# Patient Record
Sex: Female | Born: 1940 | Race: Black or African American | Hispanic: No | Marital: Married | Smoking: Never smoker
Health system: Southern US, Community
[De-identification: ages and names within clinical notes are randomized; demographics above are authoritative.]

## PROBLEM LIST (undated history)

## (undated) DIAGNOSIS — C4491 Basal cell carcinoma of skin, unspecified: Secondary | ICD-10-CM

## (undated) DIAGNOSIS — I10 Essential (primary) hypertension: Secondary | ICD-10-CM

## (undated) DIAGNOSIS — Z972 Presence of dental prosthetic device (complete) (partial): Secondary | ICD-10-CM

## (undated) DIAGNOSIS — M1990 Unspecified osteoarthritis, unspecified site: Secondary | ICD-10-CM

## (undated) DIAGNOSIS — E119 Type 2 diabetes mellitus without complications: Secondary | ICD-10-CM

## (undated) HISTORY — DX: Basal cell carcinoma of skin, unspecified: C44.91

## (undated) HISTORY — PX: BLADDER SURGERY: SHX569

---

## 2006-02-05 ENCOUNTER — Ambulatory Visit: Payer: Self-pay

## 2007-11-27 DIAGNOSIS — E785 Hyperlipidemia, unspecified: Secondary | ICD-10-CM | POA: Insufficient documentation

## 2007-12-11 DIAGNOSIS — I10 Essential (primary) hypertension: Secondary | ICD-10-CM | POA: Insufficient documentation

## 2008-01-07 ENCOUNTER — Ambulatory Visit: Payer: Self-pay

## 2009-02-24 ENCOUNTER — Ambulatory Visit: Payer: Self-pay

## 2009-11-29 ENCOUNTER — Inpatient Hospital Stay: Payer: Self-pay | Admitting: Internal Medicine

## 2010-06-07 ENCOUNTER — Ambulatory Visit: Payer: Self-pay | Admitting: Family Medicine

## 2011-02-13 DIAGNOSIS — M8580 Other specified disorders of bone density and structure, unspecified site: Secondary | ICD-10-CM | POA: Insufficient documentation

## 2012-12-26 ENCOUNTER — Ambulatory Visit: Payer: Self-pay | Admitting: Family Medicine

## 2013-07-28 DIAGNOSIS — N819 Female genital prolapse, unspecified: Secondary | ICD-10-CM | POA: Insufficient documentation

## 2014-06-10 DIAGNOSIS — N3946 Mixed incontinence: Secondary | ICD-10-CM | POA: Insufficient documentation

## 2015-02-08 ENCOUNTER — Ambulatory Visit: Payer: Self-pay | Admitting: Family Medicine

## 2016-05-14 ENCOUNTER — Emergency Department: Payer: Medicare Other

## 2016-05-14 ENCOUNTER — Encounter: Payer: Self-pay | Admitting: Emergency Medicine

## 2016-05-14 ENCOUNTER — Observation Stay
Admission: EM | Admit: 2016-05-14 | Discharge: 2016-05-15 | Disposition: A | Discharge status: Home / Self Care | Payer: Medicare Other | Attending: Internal Medicine | Admitting: Internal Medicine

## 2016-05-14 DIAGNOSIS — E876 Hypokalemia: Secondary | ICD-10-CM | POA: Insufficient documentation

## 2016-05-14 DIAGNOSIS — I081 Rheumatic disorders of both mitral and tricuspid valves: Secondary | ICD-10-CM | POA: Insufficient documentation

## 2016-05-14 DIAGNOSIS — I10 Essential (primary) hypertension: Secondary | ICD-10-CM | POA: Insufficient documentation

## 2016-05-14 DIAGNOSIS — R531 Weakness: Secondary | ICD-10-CM | POA: Insufficient documentation

## 2016-05-14 DIAGNOSIS — R51 Headache: Secondary | ICD-10-CM | POA: Diagnosis not present

## 2016-05-14 DIAGNOSIS — G8194 Hemiplegia, unspecified affecting left nondominant side: Secondary | ICD-10-CM | POA: Diagnosis not present

## 2016-05-14 DIAGNOSIS — G458 Other transient cerebral ischemic attacks and related syndromes: Secondary | ICD-10-CM | POA: Diagnosis present

## 2016-05-14 DIAGNOSIS — Z634 Disappearance and death of family member: Secondary | ICD-10-CM | POA: Insufficient documentation

## 2016-05-14 DIAGNOSIS — R4781 Slurred speech: Secondary | ICD-10-CM | POA: Diagnosis not present

## 2016-05-14 DIAGNOSIS — R29898 Other symptoms and signs involving the musculoskeletal system: Secondary | ICD-10-CM | POA: Diagnosis present

## 2016-05-14 DIAGNOSIS — E119 Type 2 diabetes mellitus without complications: Secondary | ICD-10-CM | POA: Insufficient documentation

## 2016-05-14 DIAGNOSIS — I493 Ventricular premature depolarization: Secondary | ICD-10-CM | POA: Insufficient documentation

## 2016-05-14 DIAGNOSIS — R200 Anesthesia of skin: Secondary | ICD-10-CM | POA: Insufficient documentation

## 2016-05-14 DIAGNOSIS — Z7982 Long term (current) use of aspirin: Secondary | ICD-10-CM | POA: Insufficient documentation

## 2016-05-14 DIAGNOSIS — I639 Cerebral infarction, unspecified: Secondary | ICD-10-CM | POA: Diagnosis not present

## 2016-05-14 DIAGNOSIS — E785 Hyperlipidemia, unspecified: Secondary | ICD-10-CM | POA: Diagnosis not present

## 2016-05-14 DIAGNOSIS — G459 Transient cerebral ischemic attack, unspecified: Secondary | ICD-10-CM | POA: Diagnosis present

## 2016-05-14 DIAGNOSIS — Z8673 Personal history of transient ischemic attack (TIA), and cerebral infarction without residual deficits: Secondary | ICD-10-CM | POA: Diagnosis not present

## 2016-05-14 DIAGNOSIS — Z79899 Other long term (current) drug therapy: Secondary | ICD-10-CM | POA: Diagnosis not present

## 2016-05-14 DIAGNOSIS — Z8249 Family history of ischemic heart disease and other diseases of the circulatory system: Secondary | ICD-10-CM | POA: Diagnosis not present

## 2016-05-14 DIAGNOSIS — I4581 Long QT syndrome: Secondary | ICD-10-CM | POA: Diagnosis not present

## 2016-05-14 HISTORY — DX: Essential (primary) hypertension: I10

## 2016-05-14 LAB — COMPREHENSIVE METABOLIC PANEL
ALBUMIN: 4.6 g/dL (ref 3.5–5.0)
ALK PHOS: 98 U/L (ref 38–126)
ALT: 24 U/L (ref 14–54)
AST: 21 U/L (ref 15–41)
Anion gap: 8 (ref 5–15)
BILIRUBIN TOTAL: 0.6 mg/dL (ref 0.3–1.2)
BUN: 11 mg/dL (ref 6–20)
CALCIUM: 9.2 mg/dL (ref 8.9–10.3)
CO2: 28 mmol/L (ref 22–32)
Chloride: 99 mmol/L — ABNORMAL LOW (ref 101–111)
Creatinine, Ser: 0.64 mg/dL (ref 0.44–1.00)
GFR calc Af Amer: >60 mL/min (ref >60)
GLUCOSE: 260 mg/dL — AB (ref 65–99)
Potassium: 3.3 mmol/L — ABNORMAL LOW (ref 3.5–5.1)
Sodium: 135 mmol/L (ref 135–145)
TOTAL PROTEIN: 8 g/dL (ref 6.5–8.1)

## 2016-05-14 LAB — CBC
HEMATOCRIT: 42.5 % (ref 35.0–47.0)
HEMOGLOBIN: 14.1 g/dL (ref 12.0–16.0)
MCH: 26.5 pg (ref 26.0–34.0)
MCHC: 33.1 g/dL (ref 32.0–36.0)
MCV: 80.3 fL (ref 80.0–100.0)
Platelets: 218 10*3/uL (ref 150–440)
RBC: 5.29 MIL/uL — AB (ref 3.80–5.20)
RDW: 13.5 % (ref 11.5–14.5)
WBC: 9.8 10*3/uL (ref 3.6–11.0)

## 2016-05-14 LAB — DIFFERENTIAL
Basophils Absolute: 0.1 10*3/uL (ref 0–0.1)
Basophils Relative: 1 %
Eosinophils Absolute: 0.1 10*3/uL (ref 0–0.7)
LYMPHS ABS: 1.6 10*3/uL (ref 1.0–3.6)
Monocytes Absolute: 0.5 10*3/uL (ref 0.2–0.9)
Neutro Abs: 7.5 10*3/uL — ABNORMAL HIGH (ref 1.4–6.5)

## 2016-05-14 LAB — TROPONIN I: Troponin I: <0.03 ng/mL (ref <0.031)

## 2016-05-14 MED ORDER — ENOXAPARIN SODIUM 40 MG/0.4ML ~~LOC~~ SOLN
40.0000 mg | SUBCUTANEOUS | Status: DC
  Administered 2016-05-14: 40 mg via SUBCUTANEOUS
  Filled 2016-05-14: qty 0.4

## 2016-05-14 MED ORDER — STROKE: EARLY STAGES OF RECOVERY BOOK
Freq: Once | Status: AC
  Administered 2016-05-14: 18:00:00

## 2016-05-14 MED ORDER — TIMOLOL MALEATE 0.5 % OP SOLN
1.0000 [drp] | Freq: Two times a day (BID) | OPHTHALMIC | Status: DC
Start: 2016-05-14 — End: 2016-05-15
  Administered 2016-05-14 – 2016-05-15 (×2): 1 [drp] via OPHTHALMIC
  Filled 2016-05-14: qty 5

## 2016-05-14 MED ORDER — LATANOPROST 0.005 % OP SOLN
1.0000 [drp] | Freq: Every day | OPHTHALMIC | Status: DC
  Administered 2016-05-14: 1 [drp] via OPHTHALMIC
  Filled 2016-05-14: qty 2.5

## 2016-05-14 MED ORDER — ASPIRIN 325 MG PO TABS
325.0000 mg | ORAL_TABLET | Freq: Every day | ORAL | Status: DC
  Administered 2016-05-14 – 2016-05-15 (×2): 325 mg via ORAL
  Filled 2016-05-14 (×2): qty 1

## 2016-05-14 MED ORDER — POTASSIUM CHLORIDE 10 MEQ/100ML IV SOLN
10.0000 meq | INTRAVENOUS | Status: AC
  Administered 2016-05-14 – 2016-05-15 (×3): 10 meq via INTRAVENOUS
  Filled 2016-05-14 (×4): qty 100

## 2016-05-14 MED ORDER — ASPIRIN 300 MG RE SUPP
300.0000 mg | Freq: Every day | RECTAL | Status: DC
  Filled 2016-05-14 (×2): qty 1

## 2016-05-14 MED ORDER — ASPIRIN 81 MG PO CHEW
324.0000 mg | CHEWABLE_TABLET | Freq: Once | ORAL | Status: AC
  Administered 2016-05-14: 324 mg via ORAL
  Filled 2016-05-14: qty 4

## 2016-05-14 NOTE — H&P (Signed)
[facility] [place] NAME: Cindy Singh    MR#:  [id number]  DATE OF BIRTH:  30-Oct-1941  DATE OF ADMISSION:  05/14/2016  PRIMARY CARE PHYSICIAN: [place]   REQUESTING/REFERRING PHYSICIAN: Joni Fears  CHIEF COMPLAINT:  Headache and left-sided weakness  HISTORY OF PRESENT ILLNESS:  Cindy Singh  is a 75 y.o. female with a known history of Essential hypertension and hyperlipidemia is brought into the ED for headache and left-sided weakness. Patient woke up this morning with bilateral headache and left-sided weakness and numbness. According to the daughter patient was talking very slowly and her speech was not clear when she called her around noontime. Patient's husband passed away yesterday and patient is under a lot of stress. Initial CT head is negative Speech is better during my examination and left-sided tingling and numbness is improving.  PAST MEDICAL HISTORY:   Past Medical History  Diagnosis Date  . Hypertension    Hyperlipidemia PAST SURGICAL HISTOIRY:   Past Surgical History  Procedure Laterality Date  . Bladder surgery      SOCIAL HISTORY:   Social History  Substance Use Topics  . Smoking status: Never Smoker   . Smokeless tobacco: Not on file  . Alcohol Use: No    FAMILY HISTORY:  Hypertension runs in her family  DRUG ALLERGIES:  No Known Allergies  REVIEW OF SYSTEMS:  CONSTITUTIONAL: No fever, fatigue or weakness.  EYES: No blurred or double vision.  EARS, NOSE, AND THROAT: No tinnitus or ear pain.  RESPIRATORY: No cough, shortness of breath, wheezing or hemoptysis.  CARDIOVASCULAR: No chest pain, orthopnea, edema.  GASTROINTESTINAL: No nausea, vomiting, diarrhea or abdominal pain.  GENITOURINARY: No dysuria, hematuria.  ENDOCRINE: No polyuria, nocturia,  HEMATOLOGY: No anemia, easy bruising or bleeding SKIN: No rash or lesion. MUSCULOSKELETAL: No joint pain or  arthritis.   NEUROLOGIC:Reporting headache and left upper extremity and lower extremity weakness and numbness  PSYCHIATRY: Feeling sad since her husband passed away yesterday No anxiety or depression.   MEDICATIONS AT HOME:   Prior to Admission medications   Medication Sig Start Date End Date Taking? Authorizing Provider  acetaminophen (TYLENOL) 500 MG tablet Take 1,000 mg by mouth every 6 (six) hours as needed for mild pain or headache.   Yes Historical Provider, MD  aspirin EC 81 MG tablet Take 81 mg by mouth daily.   Yes Historical Provider, MD  Calcium-Magnesium-Vitamin D (CALCIUM 1200+D3 PO) Take 1 tablet by mouth at bedtime.   Yes Historical Provider, MD  Garlic 10 MG CAPS Take 10 mg by mouth at bedtime.   Yes Historical Provider, MD  latanoprost (XALATAN) 0.005 % ophthalmic solution Place 1 drop into the right eye at bedtime.   Yes Historical Provider, MD  Multiple Vitamin (MULTIVITAMIN WITH MINERALS) TABS tablet Take 1 tablet by mouth daily.   Yes Historical Provider, MD  simvastatin (ZOCOR) 10 MG tablet Take 10 mg by mouth at bedtime.   Yes Historical Provider, MD  timolol (TIMOPTIC) 0.5 % ophthalmic solution Place 1 drop into both eyes 2 (two) times daily.   Yes Historical Provider, MD      VITAL SIGNS:  Blood pressure 167/98, pulse 80, temperature 98.1 F (36.7 C), temperature source Oral, resp. rate 20, height 5\' 3"  (1.6 m), weight 77.111 kg (170 lb), SpO2 96 %.  PHYSICAL EXAMINATION:  GENERAL:  75 y.o.-year-old patient lying in the bed with no acute distress.  EYES: Pupils equal, round, reactive  to light and accommodation. No scleral icterus. Extraocular muscles intact.  HEENT: Head atraumatic, normocephalic. Oropharynx and nasopharynx clear.  NECK:  Supple, no jugular venous distention. No thyroid enlargement, no tenderness.  LUNGS: Normal breath sounds bilaterally, no wheezing, rales,rhonchi or crepitation. No use of accessory muscles of respiration.  CARDIOVASCULAR: S1,  S2 normal. No murmurs, rubs, or gallops.  ABDOMEN: Soft, nontender, nondistended. Bowel sounds present. No organomegaly or mass.  EXTREMITIES: No pedal edema, cyanosis, or clubbing.  NEUROLOGIC: Cranial nerves II through XII are intact. Muscle strength 5/5 in all extremities. Sensation intact. Gait not checked.  PSYCHIATRIC: The patient is alert and oriented x 3.  SKIN: No obvious rash, lesion, or ulcer.   LABORATORY PANEL:   CBC  Recent Labs Lab 05/14/16 1508  WBC 9.8  HGB 14.1  HCT 42.5  PLT 218   ------------------------------------------------------------------------------------------------------------------  Chemistries   Recent Labs Lab 05/14/16 1508  NA 135  K 3.3*  CL 99*  CO2 28  GLUCOSE 260*  BUN 11  CREATININE 0.64  CALCIUM 9.2  AST 21  ALT 24  ALKPHOS 98  BILITOT 0.6   ------------------------------------------------------------------------------------------------------------------  Cardiac Enzymes  Recent Labs Lab 05/14/16 1508  TROPONINI <0.03   ------------------------------------------------------------------------------------------------------------------  RADIOLOGY:  Ct Head Wo Contrast  05/14/2016  CLINICAL DATA:  Left arm weakness starting this morning EXAM: CT HEAD WITHOUT CONTRAST TECHNIQUE: Contiguous axial images were obtained from the base of the skull through the vertex without intravenous contrast. COMPARISON:  None. FINDINGS: No intracranial hemorrhage, mass effect or midline shift. No definite acute cortical infarction. No mass lesion is noted on this unenhanced scan. Mild cerebral atrophy. Mild periventricular and patchy subcortical white matter decreased attenuation probable due to chronic small vessel ischemic changes. No skull fracture is noted. Paranasal sinuses and mastoid air cells are unremarkable. No pathologic calcifications are noted. IMPRESSION: No acute intracranial abnormality. No definite acute cortical infarction. Mild  cerebral atrophy. Mild periventricular and patchy subcortical white matter decreased attenuation probable due to chronic small vessel ischemic changes. Electronically Signed   By: Lahoma Crocker M.D.   On: 05/14/2016 14:57    EKG:   Orders placed or performed during the hospital encounter of 05/14/16  . EKG 12-Lead  . EKG 12-Lead  . ED EKG  . ED EKG    IMPRESSION AND PLAN:   Darnette Kusz  is a 75 y.o. female with a known history of Essential hypertension and hyperlipidemia is brought into the ED for headache and left-sided weakness. Patient woke up this morning with bilateral headache and left-sided weakness and numbness. According to the daughter patient was talking very slowly and her speech was not clear when she called her around noontime. Patient's husband passed away yesterday and patient is under a lot of stress. Initial CT head is negative   #TIA versus CVA Admit to MedSurg unit. CT head is negative. Her stroke workup with MRA of the brain and carotid Dopplers and 2-D echocardiogram Fasting lipid panel and hemoglobin A1c check PT and OT consult Bedside swallow evaluation by the RN Aspirin by mouth or PR Neurology consult Allow permissive hypertension  #Essential hypertension Blood pressure is elevated at this time but will allow permissive hypertension at this time Patient's blood pressure medications were discontinued by primary care physician few months ago as it was running low   #Hypokalemia replete potassium. Check a.m. labs  #Hyperlipidemia Check fasting lipid panel. Will start patient on high intensity statin after passing bedside swallow evaluation  #Bereavement reaction Patient  has good family support and social support. Seems to be coping well  Provide GI and DVT prophylaxis  All the records are reviewed and case discussed with ED provider. Management plans discussed with the patient, family and they are in agreement.  CODE STATUS: Full code/2 daughters  are the healthcare power of attorney  TOTAL TIME TAKING CARE OF THIS PATIENT:  21minutes.    Nicholes Mango M.D on 05/14/2016 at 5:27 PM  Between 7am to 6pm - Pager - [phone]  After 6pm go to [email or web address] - password [email or web address] [facility]  Office  [phone]  CC: Primary care physician; [place]

## 2016-05-14 NOTE — ED Notes (Signed)
States approx 830 or 9am noted L arm weakness. Speech slow but clear. L arm grip weak. States she awoke with headache but is not sure if her arm was weak at that time.

## 2016-05-14 NOTE — ED Provider Notes (Signed)
[facility] Emergency Department Provider Note  ____________________________________________  Time seen: 2:50 PM  I have reviewed the triage vital signs and the nursing notes.   HISTORY  Chief Complaint Extremity Weakness    HPI Cindy Singh is a 75 y.o. female who awoke this morning with a bilateral headache and left arm weakness and numbness. This was about 8:30 AM today. She was last known normal at less than at bedtime. She is also noted to be speaking very slowly when her daughter talked to her this morning. The nurse came to the house and noted that the left arm was weak as well. It seems to be improving at this moment.   Primary care is [facility]  Past Medical History  Diagnosis Date  . Hypertension      There are no active problems to display for this patient.    Past Surgical History  Procedure Laterality Date  . Bladder surgery       No current outpatient prescriptions on file. Aspirin Statin Multivitamin  Allergies Review of patient's allergies indicates no known allergies.   No family history on file.  Social History Social History  Substance Use Topics  . Smoking status: Never Smoker   . Smokeless tobacco: None  . Alcohol Use: No    Review of Systems  Constitutional:   No fever or chills.  Eyes:   No vision changes.  ENT:   No sore throat. No rhinorrhea. Cardiovascular:   No chest pain. Respiratory:   No dyspnea or cough. Gastrointestinal:   Negative for abdominal pain, vomiting and diarrhea.  Genitourinary:   Negative for dysuria or difficulty urinating. Musculoskeletal:   Negative for focal pain or swelling Neurological:   Positive bilateral frontal headache. Left arm weakness and numbness 10-point ROS otherwise negative.  ____________________________________________   PHYSICAL EXAM:  VITAL SIGNS: ED Triage Vitals  Enc Vitals Group     BP 05/14/16 1417 154/76 mmHg     Pulse Rate  05/14/16 1417 71     Resp 05/14/16 1417 20     Temp 05/14/16 1417 98.1 F (36.7 C)     Temp Source 05/14/16 1417 Oral     SpO2 05/14/16 1417 99 %     Weight 05/14/16 1417 170 lb (77.111 kg)     Height 05/14/16 1417 5\' 3"  (1.6 m)     Head Cir --      Peak Flow --      Pain Score 05/14/16 1418 10     Pain Loc --      Pain Edu? --      Excl. in [place]? --     Vital signs reviewed, nursing assessments reviewed.   Constitutional:   Alert and oriented. Well appearing and in no distress. Eyes:   No scleral icterus. No conjunctival pallor. PERRL. EOMI.  No nystagmus. ENT   Head:   Normocephalic and atraumatic.   Nose:   No congestion/rhinnorhea. No septal hematoma   Mouth/Throat:   MMM, no pharyngeal erythema. No peritonsillar mass.    Neck:   No stridor. No SubQ emphysema. No meningismus. Hematological/Lymphatic/Immunilogical:   No cervical lymphadenopathy. Cardiovascular:   RRR. Symmetric bilateral radial and DP pulses.  No murmurs.  Respiratory:   Normal respiratory effort without tachypnea nor retractions. Breath sounds are clear and equal bilaterally. No wheezes/rales/rhonchi. Gastrointestinal:   Soft and nontender. Non distended. There is no CVA tenderness.  No rebound, rigidity, or guarding. Genitourinary:   deferred Musculoskeletal:  Nontender with normal range of motion in all extremities. No joint effusions.  No lower extremity tenderness.  No edema. Neurologic:   Normal speech and language.  CN 2-10 normal. Motor grossly intact. No gross focal neurologic deficits are appreciated.  NIH stroke scale 0 Skin:    Skin is warm, dry and intact. No rash noted.  No petechiae, purpura, or bullae.  ____________________________________________    LABS (pertinent positives/negatives) (all labs ordered are listed, but only abnormal results are displayed) Labs Reviewed  CBC - Abnormal; Notable for the following:    RBC 5.29 (*)    All other components within normal limits   DIFFERENTIAL - Abnormal; Notable for the following:    Neutro Abs 7.5 (*)    All other components within normal limits  COMPREHENSIVE METABOLIC PANEL - Abnormal; Notable for the following:    Potassium 3.3 (*)    Chloride 99 (*)    Glucose, Bld 260 (*)    All other components within normal limits  TROPONIN I   ____________________________________________   EKG  Interpreted by me Sinus rhythm rate of 75, normal axis, prolonged QTC at 540 ms. Poor R-wave progression in anterior precordial leads. Normal ST segments. 3 PVCs on the strip. Isolated T-wave inversion in aVF which is nonspecific.  ____________________________________________    RADIOLOGY  CT head unremarkable  ____________________________________________   PROCEDURES   ____________________________________________   INITIAL IMPRESSION / ASSESSMENT AND PLAN / ED COURSE  Pertinent labs & imaging results that were available during my care of the patient were reviewed by me and considered in my medical decision making (see chart for details).  Patient well appearing no acute distress. Complains of clear stroke symptoms, unclear onset but definitely greater than 3 hours. By presentation to the emergency department it was loosely 6 hours since she woke up and noticed the symptoms which may have been present all night. Not a TPA candidate. Also not a candidate because of rapid improvement of symptoms and now resolution of stroke symptoms. Stroke scale is currently 0. We'll hospitalize for stroke workup given history of hyperlipidemia and no prior history of same symptoms.   ____________________________________________   FINAL CLINICAL IMPRESSION(S) / ED DIAGNOSES  Final diagnoses:  Other specified transient cerebral ischemias  Left arm weakness       Portions of this note were generated with dragon dictation software. Dictation errors may occur despite best attempts at proofreading.   Carrie Mew,  MD 05/14/16 [phone]

## 2016-05-14 NOTE — ED Notes (Signed)
Patient transported to CT 

## 2016-05-15 ENCOUNTER — Observation Stay: Payer: Medicare Other

## 2016-05-15 ENCOUNTER — Observation Stay
Admit: 2016-05-15 | Discharge: 2016-05-15 | Disposition: A | Discharge status: Home / Self Care | Payer: Medicare Other | Attending: Internal Medicine | Admitting: Internal Medicine

## 2016-05-15 DIAGNOSIS — I639 Cerebral infarction, unspecified: Secondary | ICD-10-CM

## 2016-05-15 LAB — ECHOCARDIOGRAM COMPLETE
Height: 63 in
WEIGHTICAEL: 2720 [oz_av]

## 2016-05-15 LAB — LIPID PANEL
Cholesterol: 199 mg/dL (ref 0–200)
HDL: 39 mg/dL — AB (ref >40)
LDL Cholesterol: 99 mg/dL (ref 0–99)
TRIGLYCERIDES: 304 mg/dL — AB (ref <150)
Total CHOL/HDL Ratio: 5.1 RATIO
VLDL: 61 mg/dL — ABNORMAL HIGH (ref 0–40)

## 2016-05-15 LAB — MAGNESIUM: MAGNESIUM: 2 mg/dL (ref 1.7–2.4)

## 2016-05-15 LAB — HEMOGLOBIN A1C: HEMOGLOBIN A1C: 9.1 % — AB (ref 4.0–6.0)

## 2016-05-15 MED ORDER — CLOPIDOGREL BISULFATE 75 MG PO TABS: 75.0000 mg | ORAL_TABLET | Freq: Every day | ORAL | Status: DC

## 2016-05-15 MED ORDER — METOPROLOL TARTRATE 25 MG PO TABS
12.5000 mg | ORAL_TABLET | Freq: Two times a day (BID) | ORAL | Status: DC
  Administered 2016-05-15: 12:00:00 12.5 mg via ORAL
  Filled 2016-05-15: qty 1

## 2016-05-15 MED ORDER — ATORVASTATIN CALCIUM 20 MG PO TABS
40.0000 mg | ORAL_TABLET | Freq: Every day | ORAL | Status: DC
  Administered 2016-05-15: 40 mg via ORAL
  Filled 2016-05-15: qty 2

## 2016-05-15 MED ORDER — METOPROLOL TARTRATE 25 MG PO TABS: 12.5000 mg | ORAL_TABLET | Freq: Two times a day (BID) | ORAL | Status: AC

## 2016-05-15 MED ORDER — CLOPIDOGREL BISULFATE 75 MG PO TABS
75.0000 mg | ORAL_TABLET | Freq: Every day | ORAL | Status: DC
  Administered 2016-05-15: 12:00:00 75 mg via ORAL
  Filled 2016-05-15: qty 1

## 2016-05-15 MED ORDER — ATORVASTATIN CALCIUM 40 MG PO TABS: 40.0000 mg | ORAL_TABLET | Freq: Every day | ORAL | Status: AC

## 2016-05-15 NOTE — Progress Notes (Signed)
Speech Therapy Note: received order, reviewed chart notes. Consulted NSG then pt and family. Pt denied any difficulty swallowing and is currently on a regular diet; tolerates swallowing pills w/ water per NSG. Pt conversed at conversational level w/out deficits noted; pt and family denied any speech-language deficits.  No further skilled ST services indicated as pt appears at her baseline. Pt agreed. NSG to reconsult if any change in status.

## 2016-05-15 NOTE — Discharge Instructions (Signed)
Heart healthy diet. °Activity as tolerated. °

## 2016-05-15 NOTE — Consult Note (Signed)
Referring Physician: Bridgett Larsson    Chief Complaint: Slurred speech, left arm numbness  HPI: Cindy Singh is an 75 y.o. female who reports that she began to have a right sided headache above the eye on Sunday.  On yesterday she awakened and the headache had returned.  Later in the morning she began to notice left arm numbness and her family noted slurred speech.  The patient was brought in for evaluation at that time.  Initial NIHSS of 0.    Date last known well: 05/14/2016 Time last known well: Time: 10:00 tPA Given: No: Minimal symptoms, outside time window  Past Medical History  Diagnosis Date  . Hypertension     Past Surgical History  Procedure Laterality Date  . Bladder surgery      Family history: Mother deceased with HTN and cancer.  Two daughters alive and well.    Social History:  reports that she has never smoked. She does not have any smokeless tobacco history on file. She reports that she does not drink alcohol. Her drug history is not on file.  Allergies: No Known Allergies  Medications:  I have reviewed the patient's current medications. Prior to Admission:  Prescriptions prior to admission  Medication Sig Dispense Refill Last Dose  . acetaminophen (TYLENOL) 500 MG tablet Take 1,000 mg by mouth every 6 (six) hours as needed for mild pain or headache.   05/14/2016 at 1000  . aspirin EC 81 MG tablet Take 81 mg by mouth daily.   05/14/2016 at 0900  . Calcium-Magnesium-Vitamin D (CALCIUM 1200+D3 PO) Take 1 tablet by mouth at bedtime.   05/13/2016 at Unknown time  . Garlic 10 MG CAPS Take 10 mg by mouth at bedtime.   05/13/2016 at Unknown time  . latanoprost (XALATAN) 0.005 % ophthalmic solution Place 1 drop into the right eye at bedtime.   05/13/2016 at Unknown time  . Multiple Vitamin (MULTIVITAMIN WITH MINERALS) TABS tablet Take 1 tablet by mouth daily.   05/14/2016 at Unknown time  . simvastatin (ZOCOR) 10 MG tablet Take 10 mg by mouth at bedtime.   05/13/2016 at  Unknown time  . timolol (TIMOPTIC) 0.5 % ophthalmic solution Place 1 drop into both eyes 2 (two) times daily.   05/14/2016 at Unknown time   Scheduled: . atorvastatin  40 mg Oral q1800  . clopidogrel  75 mg Oral Daily  . enoxaparin (LOVENOX) injection  40 mg Subcutaneous Q24H  . latanoprost  1 drop Right Eye QHS  . metoprolol tartrate  12.5 mg Oral BID  . timolol  1 drop Both Eyes BID    ROS: History obtained from the patient  General ROS: negative for - chills, fatigue, fever, night sweats, weight gain or weight loss Psychological ROS: negative for - behavioral disorder, hallucinations, memory difficulties, mood swings or suicidal ideation Ophthalmic ROS: negative for - blurry vision, double vision, eye pain or loss of vision ENT ROS: negative for - epistaxis, nasal discharge, oral lesions, sore throat, tinnitus or vertigo Allergy and Immunology ROS: negative for - hives or itchy/watery eyes Hematological and Lymphatic ROS: negative for - bleeding problems, bruising or swollen lymph nodes Endocrine ROS: negative for - galactorrhea, hair pattern changes, polydipsia/polyuria or temperature intolerance Respiratory ROS: negative for - cough, hemoptysis, shortness of breath or wheezing Cardiovascular ROS: negative for - chest pain, dyspnea on exertion, edema or irregular heartbeat Gastrointestinal ROS: negative for - abdominal pain, diarrhea, hematemesis, nausea/vomiting or stool incontinence Genito-Urinary ROS: negative for - dysuria, hematuria, incontinence  or urinary frequency/urgency Musculoskeletal ROS: negative for - joint swelling or muscular weakness Neurological ROS: as noted in HPI Dermatological ROS: negative for rash and skin lesion changes  Physical Examination: Blood pressure 157/82, pulse 77, temperature 98.1 F (36.7 C), temperature source Oral, resp. rate 17, height 5\' 3"  (1.6 m), weight 77.111 kg (170 lb), SpO2 98 %.  HEENT-  Normocephalic, no lesions, without obvious  abnormality.  Normal external eye and conjunctiva.  Normal TM's bilaterally.  Normal auditory canals and external ears. Normal external nose, mucus membranes and septum.  Normal pharynx. Cardiovascular- S1, S2 normal, pulses palpable throughout   Lungs- chest clear, no wheezing, rales, normal symmetric air entry Abdomen- soft, non-tender; bowel sounds normal; no masses,  no organomegaly Extremities- no edema Lymph-no adenopathy palpable Musculoskeletal-no joint tenderness, deformity or swelling Skin-warm and dry, no hyperpigmentation, vitiligo, or suspicious lesions  Neurological Examination Mental Status: Alert, oriented, thought content appropriate.  Speech fluent without evidence of aphasia.  Able to follow 3 step commands without difficulty. Cranial Nerves: II: Discs flat bilaterally; Visual fields grossly normal, pupils equal, round, reactive to light and accommodation III,IV, VI: ptosis not present, extra-ocular motions intact bilaterally V,VII: smile symmetric, facial light touch sensation normal bilaterally VIII: hearing normal bilaterally IX,X: gag reflex present XI: bilateral shoulder shrug XII: midline tongue extension Motor: Right : Upper extremity   5/5    Left:     Upper extremity   5/5 with mild pronator drift  Lower extremity   5/5     Lower extremity   5/5 Tone and bulk:normal tone throughout; no atrophy noted Sensory: Pinprick and light touch intact throughout, bilaterally Deep Tendon Reflexes: 2+ and symmetric with absent AJ's bilaterally Plantars: Right: downgoing   Left: downgoing Cerebellar: Normal finger-to-nose and normal heel-to-shin testing bilaterally Gait: not tested due to safety concerns   Laboratory Studies:  Basic Metabolic Panel:  Recent Labs Lab 05/14/16 1508 05/15/16 0428  NA 135  --   K 3.3*  --   CL 99*  --   CO2 28  --   GLUCOSE 260*  --   BUN 11  --   CREATININE 0.64  --   CALCIUM 9.2  --   MG  --  2.0    Liver Function  Tests:  Recent Labs Lab 05/14/16 1508  AST 21  ALT 24  ALKPHOS 98  BILITOT 0.6  PROT 8.0  ALBUMIN 4.6   No results for input(s): LIPASE, AMYLASE in the last 168 hours. No results for input(s): AMMONIA in the last 168 hours.  CBC:  Recent Labs Lab 05/14/16 1508  WBC 9.8  NEUTROABS 7.5*  HGB 14.1  HCT 42.5  MCV 80.3  PLT 218    Cardiac Enzymes:  Recent Labs Lab 05/14/16 1508  TROPONINI <0.03    BNP: Invalid input(s): POCBNP  CBG: No results for input(s): GLUCAP in the last 168 hours.  Microbiology: No results found for this or any previous visit.  Coagulation Studies: No results for input(s): LABPROT, INR in the last 72 hours.  Urinalysis: No results for input(s): COLORURINE, LABSPEC, PHURINE, GLUCOSEU, HGBUR, BILIRUBINUR, KETONESUR, PROTEINUR, UROBILINOGEN, NITRITE, LEUKOCYTESUR in the last 168 hours.  Invalid input(s): APPERANCEUR  Lipid Panel:    Component Value Date/Time   CHOL 199 05/15/2016 0428   TRIG 304* 05/15/2016 0428   HDL 39* 05/15/2016 0428   CHOLHDL 5.1 05/15/2016 0428   VLDL 61* 05/15/2016 0428   LDLCALC 99 05/15/2016 0428    HgbA1C: No results found  for: HGBA1C  Urine Drug Screen:  No results found for: LABOPIA, COCAINSCRNUR, LABBENZ, AMPHETMU, THCU, LABBARB  Alcohol Level: No results for input(s): ETH in the last 168 hours.  Other results: EKG: sinus rhythm at 75 bpm with frequent PVC's.  Imaging: Ct Head Wo Contrast  05/14/2016  CLINICAL DATA:  Left arm weakness starting this morning EXAM: CT HEAD WITHOUT CONTRAST TECHNIQUE: Contiguous axial images were obtained from the base of the skull through the vertex without intravenous contrast. COMPARISON:  None. FINDINGS: No intracranial hemorrhage, mass effect or midline shift. No definite acute cortical infarction. No mass lesion is noted on this unenhanced scan. Mild cerebral atrophy. Mild periventricular and patchy subcortical white matter decreased attenuation probable due to  chronic small vessel ischemic changes. No skull fracture is noted. Paranasal sinuses and mastoid air cells are unremarkable. No pathologic calcifications are noted. IMPRESSION: No acute intracranial abnormality. No definite acute cortical infarction. Mild cerebral atrophy. Mild periventricular and patchy subcortical white matter decreased attenuation probable due to chronic small vessel ischemic changes. Electronically Signed   By: Lahoma Crocker M.D.   On: 05/14/2016 14:57   Mr Brain Wo Contrast  05/15/2016  CLINICAL DATA:  75 year old hypertensive female with headache and left-sided weakness/numbness. Altered speech. Subsequent encounter. EXAM: MRI HEAD WITHOUT CONTRAST MRA HEAD WITHOUT CONTRAST TECHNIQUE: Multiplanar, multiecho pulse sequences of the brain and surrounding structures were obtained without intravenous contrast. Angiographic images of the head were obtained using MRA technique without contrast. COMPARISON:  05/14/2016 CT. FINDINGS: MRI HEAD FINDINGS Very small acute nonhemorrhagic infarct at the junction of the right parietal and occipital lobe. Remote right cerebellar infarct. Moderate chronic microvascular changes. 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 Otherwise no evidence of intracranial hemorrhage or intracranial mass noted on this unenhanced exam. Abnormality upper cervical spine centered at the C3 level with suggestion of cord compression. This is incompletely assessed on the present exam and contrast enhanced cervical spine MR recommended for further delineation. If the patient cannot have contrast, unenhanced MR cervical spine recommended. C4-5 cervical spondylotic changes and mild cord flattening. MRA HEAD FINDINGS Mild to moderate narrowing distal M1 segment/ bifurcation right middle cerebral artery. Mild narrowing left middle cerebral artery bifurcation. Middle cerebral artery moderate branch vessel narrowing and irregularity bilaterally. Mild  narrowing proximal A2 segment anterior cerebral artery greater on the left. Codominant vertebral arteries without significant narrowing of the distal vertebral arteries. Mild narrowing proximal basilar artery without high-grade stenosis. Nonvisualized right anterior inferior cerebellar artery. Small caliber left anterior inferior cerebellar artery. Moderate narrowing proximal left superior cerebellar artery. Mild to moderate narrowing distal left posterior cerebral artery branches. Mild narrowing distal right posterior cerebral artery branches. IMPRESSION: MRI HEAD Very small acute nonhemorrhagic infarct at the junction of the right parietal and occipital lobe. Remote right cerebellar infarct. Moderate chronic microvascular changes. 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 Abnormality upper cervical spine centered at the C3 level with suggestion of cord compression. This is incompletely assessed on the present exam and cervical spine MR (with and without contrast) recommended for further delineation. If the patient cannot have contrast, unenhanced MR cervical spine recommended. C4-5 cervical spondylotic changes and mild cord flattening. MRA HEAD Mild to moderate narrowing distal M1 segment/ bifurcation right middle cerebral artery. Mild narrowing left middle cerebral artery bifurcation. Middle cerebral artery moderate branch vessel narrowing and irregularity bilaterally. Mild narrowing proximal A2 segment anterior cerebral artery greater on the left. Mild narrowing proximal  basilar artery without high-grade stenosis. Nonvisualized right anterior inferior cerebellar artery. Small caliber left anterior inferior cerebellar artery. Moderate narrowing proximal left superior cerebellar artery. Mild to moderate narrowing distal left posterior cerebral artery branches. Mild narrowing distal right posterior cerebral artery branches. Electronically Signed   By: Genia Del M.D.   On:  05/15/2016 10:07   Mr Jodene Nam Head/brain Wo Cm  05/15/2016  CLINICAL DATA:  75 year old hypertensive female with headache and left-sided weakness/numbness. Altered speech. Subsequent encounter. EXAM: MRI HEAD WITHOUT CONTRAST MRA HEAD WITHOUT CONTRAST TECHNIQUE: Multiplanar, multiecho pulse sequences of the brain and surrounding structures were obtained without intravenous contrast. Angiographic images of the head were obtained using MRA technique without contrast. COMPARISON:  05/14/2016 CT. FINDINGS: MRI HEAD FINDINGS Very small acute nonhemorrhagic infarct at the junction of the right parietal and occipital lobe. Remote right cerebellar infarct. Moderate chronic microvascular changes. 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 Otherwise no evidence of intracranial hemorrhage or intracranial mass noted on this unenhanced exam. Abnormality upper cervical spine centered at the C3 level with suggestion of cord compression. This is incompletely assessed on the present exam and contrast enhanced cervical spine MR recommended for further delineation. If the patient cannot have contrast, unenhanced MR cervical spine recommended. C4-5 cervical spondylotic changes and mild cord flattening. MRA HEAD FINDINGS Mild to moderate narrowing distal M1 segment/ bifurcation right middle cerebral artery. Mild narrowing left middle cerebral artery bifurcation. Middle cerebral artery moderate branch vessel narrowing and irregularity bilaterally. Mild narrowing proximal A2 segment anterior cerebral artery greater on the left. Codominant vertebral arteries without significant narrowing of the distal vertebral arteries. Mild narrowing proximal basilar artery without high-grade stenosis. Nonvisualized right anterior inferior cerebellar artery. Small caliber left anterior inferior cerebellar artery. Moderate narrowing proximal left superior cerebellar artery. Mild to moderate narrowing distal left posterior  cerebral artery branches. Mild narrowing distal right posterior cerebral artery branches. IMPRESSION: MRI HEAD Very small acute nonhemorrhagic infarct at the junction of the right parietal and occipital lobe. Remote right cerebellar infarct. Moderate chronic microvascular changes. 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 Abnormality upper cervical spine centered at the C3 level with suggestion of cord compression. This is incompletely assessed on the present exam and cervical spine MR (with and without contrast) recommended for further delineation. If the patient cannot have contrast, unenhanced MR cervical spine recommended. C4-5 cervical spondylotic changes and mild cord flattening. MRA HEAD Mild to moderate narrowing distal M1 segment/ bifurcation right middle cerebral artery. Mild narrowing left middle cerebral artery bifurcation. Middle cerebral artery moderate branch vessel narrowing and irregularity bilaterally. Mild narrowing proximal A2 segment anterior cerebral artery greater on the left. Mild narrowing proximal basilar artery without high-grade stenosis. Nonvisualized right anterior inferior cerebellar artery. Small caliber left anterior inferior cerebellar artery. Moderate narrowing proximal left superior cerebellar artery. Mild to moderate narrowing distal left posterior cerebral artery branches. Mild narrowing distal right posterior cerebral artery branches. Electronically Signed   By: Genia Del M.D.   On: 05/15/2016 10:07    Assessment: 75 y.o. female presenting with complaints of slurred speech and left arm numbness.  Symptoms have resolved.  MRI of the brain personally reviewed and shows a small right parieto-occipital infarct.  Patient on ASA at home.  Echocardiogram and carotid dopplers are pending.  A1c pending.  LDL 99.    Stroke Risk Factors - hypertension  Plan: 1. PT consult, OT consult, Speech consult 2. Prophylactic therapy-Antiplatelet med:  Plavix - dose 75mg  daily 3. Telemetry monitoring 4. Frequent neuro checks 5. Lipid management with target LDL<70. 6. If above work up unremarkable, patient to be followed up on an outpatient basis.      Alexis Goodell, MD Neurology [phone] 05/15/2016, 11:57 AM

## 2016-05-15 NOTE — Progress Notes (Signed)
OT Cancellation Note  Patient Details Name: Cindy Singh MRN: [id number] DOB: [birth year]   Cancelled Treatment:    Reason Eval/Treat Not Completed: Patient at procedure or test/ unavailable Order received and chart reviewed.  Pt at MRI and daughter [name] in room.  Will attempt again later today.  Chrys Racer, OTR/L ascom [phone]   Wofford,Susan 05/15/2016, 10:23 AM

## 2016-05-15 NOTE — Discharge Summary (Signed)
[facility] NAME: Cindy Singh    MR#:  [id number]  DATE OF BIRTH:  [birth year]  DATE OF ADMISSION:  05/14/2016 ADMITTING PHYSICIAN: Nicholes Mango, MD  DATE OF DISCHARGE: 05/15/2016  1:42 PM  PRIMARY CARE PHYSICIAN: [place]    ADMISSION DIAGNOSIS:  Other specified transient cerebral ischemias [G45.8] Left arm weakness [R29.898]   DISCHARGE DIAGNOSIS:   Acute CVA SECONDARY DIAGNOSIS:   Past Medical History  Diagnosis Date  . Hypertension     HOSPITAL COURSE:  Acute CVA  CT head is negative. MRI of the brain shows acute CVA. Her stroke workup with MRA of the brain and carotid Dopplers: No stenosis and pending 2-D echocardiogram.  The patient was treated with aspirin 325 mg 1 dose last night. Discontinue aspirin, Changed to Plavix 75 mg by mouth daily and add Lipitor 40 mg daily per neurologist.  Hyperlipidemia. Start Lipitor.  Diabetes. hemoglobin A1C: 9.1. Diabetes diet.  PT and OT consult: No home PT or OT. Hypertension. BP 157/82. Start low-dose Lopressor 12.5 mg by mouth twice a day after discharge.  #Hypokalemia replete potassium.   I discussed with Dr. Tyron Russell, neurologist. DISCHARGE CONDITIONS:   Stable, discharged to home today.  CONSULTS OBTAINED:  Treatment Team:  Alexis Goodell, MD  DRUG ALLERGIES:  No Known Allergies  DISCHARGE MEDICATIONS:   Discharge Medication List as of 05/15/2016 12:14 PM    START taking these medications   Details  atorvastatin (LIPITOR) 40 MG tablet Take 1 tablet (40 mg total) by mouth daily at 6 PM., Starting 05/15/2016, Until Discontinued, Print    clopidogrel (PLAVIX) 75 MG tablet Take 1 tablet (75 mg total) by mouth daily., Starting 05/15/2016, Until Discontinued, Print    metoprolol tartrate (LOPRESSOR) 25 MG tablet Take 0.5 tablets (12.5 mg total) by mouth 2 (two) times daily., Starting 05/15/2016, Until Discontinued, Print       CONTINUE these medications which have NOT CHANGED   Details  acetaminophen (TYLENOL) 500 MG tablet Take 1,000 mg by mouth every 6 (six) hours as needed for mild pain or headache., Until Discontinued, Historical Med    Calcium-Magnesium-Vitamin D (CALCIUM 1200+D3 PO) Take 1 tablet by mouth at bedtime., Until Discontinued, Historical Med    Garlic 10 MG CAPS Take 10 mg by mouth at bedtime., Until Discontinued, Historical Med    latanoprost (XALATAN) 0.005 % ophthalmic solution Place 1 drop into the right eye at bedtime., Until Discontinued, Historical Med    Multiple Vitamin (MULTIVITAMIN WITH MINERALS) TABS tablet Take 1 tablet by mouth daily., Until Discontinued, Historical Med    timolol (TIMOPTIC) 0.5 % ophthalmic solution Place 1 drop into both eyes 2 (two) times daily., Until Discontinued, Historical Med      STOP taking these medications     aspirin EC 81 MG tablet      simvastatin (ZOCOR) 10 MG tablet          DISCHARGE INSTRUCTIONS:    If you experience worsening of your admission symptoms, develop shortness of breath, life threatening emergency, suicidal or homicidal thoughts you must seek medical attention immediately by calling 911 or calling your MD immediately  if symptoms less severe.  You Must read complete instructions/literature along with all the possible adverse reactions/side effects for all the Medicines you take and that have been prescribed to you. Take any new Medicines after you have completely understood and accept all the possible adverse reactions/side effects.   Please note  You were cared for by a hospitalist during your hospital stay. If you have any questions about your discharge medications or the care you received while you were in the hospital after you are discharged, you can call the unit and asked to speak with the hospitalist on call if the hospitalist that took care of you is not available. Once you are discharged, your primary care physician  will handle any further medical issues. Please note that NO REFILLS for any discharge medications will be authorized once you are discharged, as it is imperative that you return to your primary care physician (or establish a relationship with a primary care physician if you do not have one) for your aftercare needs so that they can reassess your need for medications and monitor your lab values.    Today   SUBJECTIVE   No complaint.   VITAL SIGNS:  Blood pressure 157/82, pulse 77, temperature 98.1 F (36.7 C), temperature source Oral, resp. rate 17, height 5\' 3"  (1.6 m), weight 77.111 kg (170 lb), SpO2 98 %.  I/O:  No intake or output data in the 24 hours ending 05/15/16 1654  PHYSICAL EXAMINATION:  GENERAL:  75 y.o.-year-old patient lying in the bed with no acute distress.  EYES: Pupils equal, round, reactive to light and accommodation. No scleral icterus. Extraocular muscles intact.  HEENT: Head atraumatic, normocephalic. Oropharynx and nasopharynx clear.  NECK:  Supple, no jugular venous distention. No thyroid enlargement, no tenderness.  LUNGS: Normal breath sounds bilaterally, no wheezing, rales,rhonchi or crepitation. No use of accessory muscles of respiration.  CARDIOVASCULAR: S1, S2 normal. No murmurs, rubs, or gallops.  ABDOMEN: Soft, non-tender, non-distended. Bowel sounds present. No organomegaly or mass.  EXTREMITIES: No pedal edema, cyanosis, or clubbing.  NEUROLOGIC: Cranial nerves II through XII are intact. Muscle strength 5/5 in all extremities. Sensation intact. Gait not checked.  PSYCHIATRIC: The patient is alert and oriented x 3.  SKIN: No obvious rash, lesion, or ulcer.   DATA REVIEW:   CBC  Recent Labs Lab 05/14/16 1508  WBC 9.8  HGB 14.1  HCT 42.5  PLT 218    Chemistries   Recent Labs Lab 05/14/16 1508 05/15/16 0428  NA 135  --   K 3.3*  --   CL 99*  --   CO2 28  --   GLUCOSE 260*  --   BUN 11  --   CREATININE 0.64  --   CALCIUM 9.2  --    MG  --  2.0  AST 21  --   ALT 24  --   ALKPHOS 98  --   BILITOT 0.6  --     Cardiac Enzymes  Recent Labs Lab 05/14/16 1508  TROPONINI <0.03    Microbiology Results  No results found for this or any previous visit.  RADIOLOGY:  Ct Head Wo Contrast  05/14/2016  CLINICAL DATA:  Left arm weakness starting this morning EXAM: CT HEAD WITHOUT CONTRAST TECHNIQUE: Contiguous axial images were obtained from the base of the skull through the vertex without intravenous contrast. COMPARISON:  None. FINDINGS: No intracranial hemorrhage, mass effect or midline shift. No definite acute cortical infarction. No mass lesion is noted on this unenhanced scan. Mild cerebral atrophy. Mild periventricular and patchy subcortical white matter decreased attenuation probable due to chronic small vessel ischemic changes. No skull fracture is noted. Paranasal sinuses and mastoid air cells are unremarkable. No pathologic calcifications are noted. IMPRESSION: No acute intracranial abnormality. No definite acute cortical infarction. Mild  cerebral atrophy. Mild periventricular and patchy subcortical white matter decreased attenuation probable due to chronic small vessel ischemic changes. Electronically Signed   By: Lahoma Crocker M.D.   On: 05/14/2016 14:57   Mr Brain Wo Contrast  05/15/2016  CLINICAL DATA:  75 year old hypertensive female with headache and left-sided weakness/numbness. Altered speech. Subsequent encounter. EXAM: MRI HEAD WITHOUT CONTRAST MRA HEAD WITHOUT CONTRAST TECHNIQUE: Multiplanar, multiecho pulse sequences of the brain and surrounding structures were obtained without intravenous contrast. Angiographic images of the head were obtained using MRA technique without contrast. COMPARISON:  05/14/2016 CT. FINDINGS: MRI HEAD FINDINGS Very small acute nonhemorrhagic infarct at the junction of the right parietal and occipital lobe. Remote right cerebellar infarct. Moderate chronic microvascular changes. 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 Otherwise no evidence of intracranial hemorrhage or intracranial mass noted on this unenhanced exam. Abnormality upper cervical spine centered at the C3 level with suggestion of cord compression. This is incompletely assessed on the present exam and contrast enhanced cervical spine MR recommended for further delineation. If the patient cannot have contrast, unenhanced MR cervical spine recommended. C4-5 cervical spondylotic changes and mild cord flattening. MRA HEAD FINDINGS Mild to moderate narrowing distal M1 segment/ bifurcation right middle cerebral artery. Mild narrowing left middle cerebral artery bifurcation. Middle cerebral artery moderate branch vessel narrowing and irregularity bilaterally. Mild narrowing proximal A2 segment anterior cerebral artery greater on the left. Codominant vertebral arteries without significant narrowing of the distal vertebral arteries. Mild narrowing proximal basilar artery without high-grade stenosis. Nonvisualized right anterior inferior cerebellar artery. Small caliber left anterior inferior cerebellar artery. Moderate narrowing proximal left superior cerebellar artery. Mild to moderate narrowing distal left posterior cerebral artery branches. Mild narrowing distal right posterior cerebral artery branches. IMPRESSION: MRI HEAD Very small acute nonhemorrhagic infarct at the junction of the right parietal and occipital lobe. Remote right cerebellar infarct. Moderate chronic microvascular changes. 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 Abnormality upper cervical spine centered at the C3 level with suggestion of cord compression. This is incompletely assessed on the present exam and cervical spine MR (with and without contrast) recommended for further delineation. If the patient cannot have contrast, unenhanced MR cervical spine recommended. C4-5 cervical spondylotic changes and  mild cord flattening. MRA HEAD Mild to moderate narrowing distal M1 segment/ bifurcation right middle cerebral artery. Mild narrowing left middle cerebral artery bifurcation. Middle cerebral artery moderate branch vessel narrowing and irregularity bilaterally. Mild narrowing proximal A2 segment anterior cerebral artery greater on the left. Mild narrowing proximal basilar artery without high-grade stenosis. Nonvisualized right anterior inferior cerebellar artery. Small caliber left anterior inferior cerebellar artery. Moderate narrowing proximal left superior cerebellar artery. Mild to moderate narrowing distal left posterior cerebral artery branches. Mild narrowing distal right posterior cerebral artery branches. Electronically Signed   By: Genia Del M.D.   On: 05/15/2016 10:07   US Carotid Bilateral  05/15/2016  CLINICAL DATA:  CVA.  History of hypertension and hyperlipidemia. EXAM: BILATERAL CAROTID DUPLEX ULTRASOUND TECHNIQUE: Pearline Cables scale imaging, color Doppler and duplex ultrasound were performed of bilateral carotid and vertebral arteries in the neck. COMPARISON:  Brain MRI - 05/15/2016 FINDINGS: Criteria: Quantification of carotid stenosis is based on velocity parameters that correlate the residual internal carotid diameter with NASCET-based stenosis levels, using the diameter of the distal internal carotid lumen as the denominator for stenosis measurement. The following velocity measurements were obtained: RIGHT ICA:  97/30 cm/sec CCA:  86/12 cm/sec  SYSTOLIC ICA/CCA RATIO:  1.1 DIASTOLIC ICA/CCA RATIO:  2.6 ECA:  73 cm/sec LEFT ICA:  104/30 cm/sec CCA:  123456 cm/sec SYSTOLIC ICA/CCA RATIO:  1.2 DIASTOLIC ICA/CCA RATIO:  1.2 ECA:  55 cm/sec RIGHT CAROTID ARTERY: There is mild tortuosity of the right common carotid artery (images 3 and 8). There is a moderate amount of eccentric mixed echogenic plaque within the right carotid bulb (image 15), not resulting elevated peak systolic velocities within the  interrogated course of the right internal carotid artery to suggest a hemodynamically significant stenosis. RIGHT VERTEBRAL ARTERY:  Antegrade Flow LEFT CAROTID ARTERY: There is no grayscale evidence significant intimal thickening or atherosclerotic plaque affecting interrogated portions of the left carotid system. There are no elevated peak systolic velocities within its imaged course of the left internal carotid artery to suggest a hemodynamically significant stenosis. LEFT VERTEBRAL ARTERY:  Antegrade Flow IMPRESSION: 1. Moderate amount of right-sided atherosclerotic plaque, not resulting in a hemodynamically significant stenosis. 2. Unremarkable sonographic evaluation of the left carotid system. Electronically Signed   By: Sandi Mariscal M.D.   On: 05/15/2016 13:08   Mr Mra Head/brain Wo Cm  05/15/2016  CLINICAL DATA:  75 year old hypertensive female with headache and left-sided weakness/numbness. Altered speech. Subsequent encounter. EXAM: MRI HEAD WITHOUT CONTRAST MRA HEAD WITHOUT CONTRAST TECHNIQUE: Multiplanar, multiecho pulse sequences of the brain and surrounding structures were obtained without intravenous contrast. Angiographic images of the head were obtained using MRA technique without contrast. COMPARISON:  05/14/2016 CT. FINDINGS: MRI HEAD FINDINGS Very small acute nonhemorrhagic infarct at the junction of the right parietal and occipital lobe. Remote right cerebellar infarct. Moderate chronic microvascular changes. 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 Otherwise no evidence of intracranial hemorrhage or intracranial mass noted on this unenhanced exam. Abnormality upper cervical spine centered at the C3 level with suggestion of cord compression. This is incompletely assessed on the present exam and contrast enhanced cervical spine MR recommended for further delineation. If the patient cannot have contrast, unenhanced MR cervical spine recommended. C4-5 cervical  spondylotic changes and mild cord flattening. MRA HEAD FINDINGS Mild to moderate narrowing distal M1 segment/ bifurcation right middle cerebral artery. Mild narrowing left middle cerebral artery bifurcation. Middle cerebral artery moderate branch vessel narrowing and irregularity bilaterally. Mild narrowing proximal A2 segment anterior cerebral artery greater on the left. Codominant vertebral arteries without significant narrowing of the distal vertebral arteries. Mild narrowing proximal basilar artery without high-grade stenosis. Nonvisualized right anterior inferior cerebellar artery. Small caliber left anterior inferior cerebellar artery. Moderate narrowing proximal left superior cerebellar artery. Mild to moderate narrowing distal left posterior cerebral artery branches. Mild narrowing distal right posterior cerebral artery branches. IMPRESSION: MRI HEAD Very small acute nonhemorrhagic infarct at the junction of the right parietal and occipital lobe. Remote right cerebellar infarct. Moderate chronic microvascular changes. 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 Abnormality upper cervical spine centered at the C3 level with suggestion of cord compression. This is incompletely assessed on the present exam and cervical spine MR (with and without contrast) recommended for further delineation. If the patient cannot have contrast, unenhanced MR cervical spine recommended. C4-5 cervical spondylotic changes and mild cord flattening. MRA HEAD Mild to moderate narrowing distal M1 segment/ bifurcation right middle cerebral artery. Mild narrowing left middle cerebral artery bifurcation. Middle cerebral artery moderate branch vessel narrowing and irregularity bilaterally. Mild narrowing proximal A2 segment anterior cerebral artery greater on the left. Mild narrowing proximal basilar artery  without high-grade stenosis. Nonvisualized right anterior inferior cerebellar artery. Small caliber  left anterior inferior cerebellar artery. Moderate narrowing proximal left superior cerebellar artery. Mild to moderate narrowing distal left posterior cerebral artery branches. Mild narrowing distal right posterior cerebral artery branches. Electronically Signed   By: Genia Del M.D.   On: 05/15/2016 10:07        Management plans discussed with the patient, her daughter and they are in agreement.  CODE STATUS:  Code Status History    Date Active Date Inactive Code Status Order ID Comments User Context   05/14/2016  6:07 PM 05/15/2016  4:52 PM Full Code [id number]  Nicholes Mango, MD Inpatient      TOTAL TIME TAKING CARE OF THIS PATIENT: 37 minutes.    Demetrios Loll M.D on 05/15/2016 at 4:54 PM  Between 7am to 6pm - Pager - [phone]  After 6pm go to [email or web address] - password [email or web address] [facility]  Office  [phone]  CC: Primary care physician; [place]

## 2016-05-15 NOTE — Progress Notes (Signed)
Patient discharged home. VSS. Prescriptions given to patient. All discharge instructions given and all questions answered.

## 2016-05-15 NOTE — Progress Notes (Signed)
  Results for FAUN, BAUSER (MRN [id number]) as of 05/15/2016 12:22  Ref. Range 05/14/2016 15:08  Glucose Latest Ref Range: 65-99 mg/dL 260 (H)   Please consider checking A1C to determine if blood sugars have been elevated for the past 2-3 months.  Thanks, Adah Perl, RN, BC-ADM Inpatient Diabetes Coordinator Pager [phone] (8a-5p)

## 2016-05-15 NOTE — Evaluation (Signed)
Occupational Therapy Evaluation Patient Details Name: Cindy Singh MRN: [id number] DOB: 1941-02-26 Today's Date: 05/15/2016    History of Present Illness Pt admitted for possible TIA. Pt with complaint of headache and L side weakness. Pt with history of HTN, and hyperlipidemia. Of note, pt's husband passed away the day before and pt has been under stress lately.    Clinical Impression   Pt presents with mild fatigue but all other symptoms of headache and L side weakness with numbness and tingling have resolved. She has intact sensation and full AROM in BUEs and hands.  She does not present with any balance deficits and is able to ambulate to the bathroom without LOB and supervision only needed.  Patient is at baseline for ADLs and is not in need of any further OT. She has a shower chair with back in tub and rec supervision from daughter for first few weeks when stepping onto tub.  Pt seen for evaluation only.    Follow Up Recommendations       Equipment Recommendations       Recommendations for Other Services       Precautions / Restrictions Precautions Precautions: Fall Restrictions Weight Bearing Restrictions: No      Mobility Bed Mobility Overal bed mobility: Independent             General bed mobility comments: safe technique performed  Transfers Overall transfer level: Independent Equipment used: None             General transfer comment: safe technique performed    Balance Overall balance assessment: Independent                                          ADL Overall ADL's : At baseline                                       General ADL Comments: Pt's symjptoms have resolved and presents iwth intact sensation and at baseline for ADLs with no assist needed and no balance problems when stanidng at sink or ambulating to bathroom.     Vision     Perception     Praxis      Pertinent Vitals/Pain Pain  Assessment: No/denies pain     Hand Dominance Right   Extremity/Trunk Assessment Upper Extremity Assessment Upper Extremity Assessment: Overall WFL for tasks assessed   Lower Extremity Assessment Lower Extremity Assessment: Defer to PT evaluation       Communication Communication Communication: No difficulties   Cognition Arousal/Alertness: Awake/alert Behavior During Therapy: WFL for tasks assessed/performed Overall Cognitive Status: Within Functional Limits for tasks assessed                     General Comments       Exercises       Shoulder Instructions      Home Living Family/patient expects to be discharged to:: Private residence Living Arrangements: Alone Available Help at Discharge: Family Type of Home: House Home Access: Level entry     Home Layout: One level     Bathroom Shower/Tub: Tub/shower unit Shower/tub characteristics: Architectural technologist: Standard Bathroom Accessibility: Yes How Accessible: Accessible via wheelchair Home Equipment: Environmental consultant - 2 wheels;Cane - single point  Prior Functioning/Environment Level of Independence: Independent        Comments: driving and active in church    OT Diagnosis:     OT Problem List:     OT Treatment/Interventions:      OT Goals(Current goals can be found in the care plan section) Acute Rehab OT Goals Patient Stated Goal: to go home  OT Frequency:     Barriers to D/C:            Co-evaluation              End of Session    Activity Tolerance: Patient tolerated treatment well Patient left: in bed;with family/visitor present;with call bell/phone within reach;with bed alarm set   Time: [id number] OT Time Calculation (min): 30 min Charges:  OT General Charges $OT Visit: 1 Procedure OT Evaluation $OT Eval Low Complexity: 1 Procedure OT Treatments $Self Care/Home Management : 8-22 mins G-Codes: OT G-codes **NOT FOR INPATIENT CLASS** Functional Limitation:  Self care Self Care Current Status [id number]): 0 percent impaired, limited or restricted Self Care Goal Status [id number]): 0 percent impaired, limited or restricted   Chrys Racer, OTR/L ascom [phone] 05/15/2016, 11:04 AM

## 2016-05-15 NOTE — Care Management (Signed)
Admitted to [facility] under observation status with the diagnosis of TIA. Son, Milbert Coulter [phone]) lives with Cindy Singh. Other son is Frankey Poot [phone]) [facility] last week. No Home Health. No skilled facility. Uses no aids for ambulation. No falls. Good appetite. Takes care of all basic and instrumental activities of daily living herself, drives. Daughter will transport. Shelbie Ammons RN MSN CCM Care Management [phone]

## 2016-05-15 NOTE — Progress Notes (Signed)
*  PRELIMINARY RESULTS* Echocardiogram 2D Echocardiogram has been performed.  Cindy Singh 05/15/2016, 7:33 AM

## 2016-05-15 NOTE — Care Management Obs Status (Signed)
[facility] NOTIFICATION   Patient Details  Name: Cindy Singh MRN: [id number] Date of Birth: [birth year]   Medicare Observation Status Notification Given:  Yes    Shelbie Ammons, RN 05/15/2016, 9:48 AM

## 2016-05-15 NOTE — Evaluation (Signed)
Physical Therapy Evaluation Patient Details Name: Cindy Singh MRN: [id number] DOB: 1941/10/07 Today's Date: 05/15/2016   History of Present Illness  Pt admitted for possible TIA. Pt with complaint of headache and L side weakness. Pt with history of HTN, and hyperlipidemia. Of note, pt's husband passed away the day before and pt has been under stress lately.   Clinical Impression  Pt is a pleasant 75 year old female who was admitted for possible TIA.  Pt demonstrates all bed mobility/transfers/ambulation at baseline level. Pt with negative coordination/sensation testing. Pt does not require any further PT needs at this time. Pt feels she is at baseline level regarding physical mobility. Pt has good support system at home. Pt will be dc in house and does not require follow up. RN aware. Will dc current orders.      Follow Up Recommendations No PT follow up    Equipment Recommendations       Recommendations for Other Services       Precautions / Restrictions Precautions Precautions: Fall Restrictions Weight Bearing Restrictions: No      Mobility  Bed Mobility Overal bed mobility: Independent             General bed mobility comments: safe technique performed  Transfers Overall transfer level: Independent Equipment used: None             General transfer comment: safe technique performed  Ambulation/Gait Ambulation/Gait assistance: Min guard Ambulation Distance (Feet): 80 Feet Assistive device: None Gait Pattern/deviations: Step-through pattern     General Gait Details: pt demonstrated reciprocal gait pattern. Safe technique noted. Pt ambulated 10 ft in 7 seconds demonstrating good speed. No LOB noted.  Stairs            Wheelchair Mobility    Modified Rankin (Stroke Patients Only)       Balance Overall balance assessment: Independent                                           Pertinent Vitals/Pain Pain  Assessment: No/denies pain    Home Living Family/patient expects to be discharged to:: Private residence Living Arrangements: Alone Available Help at Discharge: Family Type of Home: House Home Access: Level entry     [place]: One [place]: Environmental consultant - 2 wheels;Cane - single point      Prior Function Level of Independence: Independent               Hand Dominance        Extremity/Trunk Assessment   Upper Extremity Assessment: Overall WFL for tasks assessed           Lower Extremity Assessment: Overall WFL for tasks assessed         Communication   Communication: No difficulties  Cognition Arousal/Alertness: Awake/alert Behavior During Therapy: WFL for tasks assessed/performed Overall Cognitive Status: Within Functional Limits for tasks assessed                      General Comments      Exercises        Assessment/Plan    PT Assessment Patent does not need any further PT services  PT Diagnosis     PT Problem List    PT Treatment Interventions     PT Goals (Current goals can be found in the Care Plan section) Acute Rehab PT  Goals Patient Stated Goal: to go home PT Goal Formulation: All assessment and education complete, DC therapy Time For Goal Achievement: 05/15/16 Potential to Achieve Goals: Good    Frequency     Barriers to discharge        Co-evaluation               End of Session Equipment Utilized During Treatment: Gait belt Activity Tolerance: Patient tolerated treatment well Patient left: in chair;with chair alarm set Nurse Communication: Mobility status    Functional Assessment Tool Used: gait speed Functional Limitation: Mobility: Walking and moving around Mobility: Walking and Moving Around Current Status [phone]): 0 percent impaired, limited or restricted Mobility: Walking and Moving Around Goal Status [phone]): 0 percent impaired, limited or restricted Mobility: Walking and Moving Around  Discharge Status [phone]): 0 percent impaired, limited or restricted    Time: [id number] PT Time Calculation (min) (ACUTE ONLY): 14 min   Charges:   PT Evaluation $PT Eval Low Complexity: 1 Procedure     PT G Codes:   PT G-Codes **NOT FOR INPATIENT CLASS** Functional Assessment Tool Used: gait speed Functional Limitation: Mobility: Walking and moving around Mobility: Walking and Moving Around Current Status [id number]): 0 percent impaired, limited or restricted Mobility: Walking and Moving Around Goal Status [phone]): 0 percent impaired, limited or restricted Mobility: Walking and Moving Around Discharge Status [phone]): 0 percent impaired, limited or restricted    Cindy Singh 05/15/2016, 10:34 AM Greggory Stallion, PT, DPT [phone]

## 2016-06-25 DIAGNOSIS — E119 Type 2 diabetes mellitus without complications: Secondary | ICD-10-CM | POA: Insufficient documentation

## 2017-05-03 ENCOUNTER — Other Ambulatory Visit: Payer: Self-pay | Admitting: Family Medicine

## 2017-05-03 DIAGNOSIS — Z1231 Encounter for screening mammogram for malignant neoplasm of breast: Secondary | ICD-10-CM

## 2017-06-10 ENCOUNTER — Ambulatory Visit
Admission: RE | Admit: 2017-06-10 | Discharge: 2017-06-10 | Disposition: A | Discharge status: Home / Self Care | Payer: Medicare Other | Source: Ambulatory Visit | Attending: Family Medicine | Admitting: Family Medicine

## 2017-06-10 DIAGNOSIS — Z1231 Encounter for screening mammogram for malignant neoplasm of breast: Secondary | ICD-10-CM | POA: Diagnosis not present

## 2018-08-19 ENCOUNTER — Ambulatory Visit
Admission: RE | Admit: 2018-08-19 | Discharge: 2018-08-19 | Disposition: A | Discharge status: Home / Self Care | Payer: Medicare Other | Source: Ambulatory Visit | Attending: Family Medicine | Admitting: Family Medicine

## 2018-08-19 ENCOUNTER — Other Ambulatory Visit: Payer: Self-pay | Admitting: Family Medicine

## 2018-08-19 DIAGNOSIS — W19XXXA Unspecified fall, initial encounter: Secondary | ICD-10-CM | POA: Insufficient documentation

## 2018-08-19 DIAGNOSIS — M19042 Primary osteoarthritis, left hand: Secondary | ICD-10-CM | POA: Insufficient documentation

## 2018-08-19 DIAGNOSIS — M7989 Other specified soft tissue disorders: Secondary | ICD-10-CM | POA: Insufficient documentation

## 2018-08-19 DIAGNOSIS — R52 Pain, unspecified: Secondary | ICD-10-CM

## 2018-08-19 DIAGNOSIS — S6992XA Unspecified injury of left wrist, hand and finger(s), initial encounter: Secondary | ICD-10-CM | POA: Diagnosis present

## 2018-08-19 DIAGNOSIS — S60222A Contusion of left hand, initial encounter: Secondary | ICD-10-CM | POA: Insufficient documentation

## 2019-08-13 DIAGNOSIS — H409 Unspecified glaucoma: Secondary | ICD-10-CM | POA: Insufficient documentation

## 2019-08-13 DIAGNOSIS — R200 Anesthesia of skin: Secondary | ICD-10-CM | POA: Insufficient documentation

## 2020-01-11 ENCOUNTER — Other Ambulatory Visit: Payer: Self-pay | Admitting: Family Medicine

## 2020-01-11 DIAGNOSIS — Z1231 Encounter for screening mammogram for malignant neoplasm of breast: Secondary | ICD-10-CM

## 2020-01-14 ENCOUNTER — Ambulatory Visit
Admission: RE | Admit: 2020-01-14 | Discharge: 2020-01-14 | Disposition: A | Discharge status: Home / Self Care | Payer: Medicare Other | Source: Ambulatory Visit | Attending: Family Medicine | Admitting: Family Medicine

## 2020-01-14 ENCOUNTER — Other Ambulatory Visit: Payer: Self-pay

## 2020-01-14 DIAGNOSIS — Z1231 Encounter for screening mammogram for malignant neoplasm of breast: Secondary | ICD-10-CM | POA: Diagnosis present

## 2020-08-02 ENCOUNTER — Encounter: Payer: Self-pay | Admitting: Ophthalmology

## 2020-08-02 ENCOUNTER — Other Ambulatory Visit: Payer: Self-pay

## 2020-08-04 ENCOUNTER — Other Ambulatory Visit: Payer: Self-pay

## 2020-08-04 ENCOUNTER — Other Ambulatory Visit
Admission: RE | Admit: 2020-08-04 | Discharge: 2020-08-04 | Disposition: A | Discharge status: Home / Self Care | Payer: Medicare Other | Source: Ambulatory Visit | Attending: Ophthalmology | Admitting: Ophthalmology

## 2020-08-04 DIAGNOSIS — Z01812 Encounter for preprocedural laboratory examination: Secondary | ICD-10-CM | POA: Insufficient documentation

## 2020-08-04 DIAGNOSIS — Z20822 Contact with and (suspected) exposure to covid-19: Secondary | ICD-10-CM | POA: Diagnosis not present

## 2020-08-04 NOTE — Discharge Instructions (Signed)

## 2020-08-05 LAB — SARS CORONAVIRUS 2 (TAT 6-24 HRS): SARS Coronavirus 2: NEGATIVE

## 2020-08-05 NOTE — Anesthesia Preprocedure Evaluation (Addendum)
Anesthesia Evaluation  Patient identified by MRN, date of birth, ID band Patient awake    Reviewed: Allergy & Precautions, NPO status , Patient's Chart, lab work & pertinent test results, reviewed documented beta blocker date and time   History of Anesthesia Complications Negative for: history of anesthetic complications  Airway Mallampati: III  TM Distance: >3 FB Neck ROM: Full    Dental  (+) Upper Dentures, Lower Dentures   Pulmonary    breath sounds clear to auscultation       Cardiovascular hypertension, (-) angina(-) DOE  Rhythm:Regular Rate:Normal     Neuro/Psych TIA   GI/Hepatic neg GERD  ,  Endo/Other  diabetes, Type 2  Renal/GU      Musculoskeletal  (+) Arthritis ,   Abdominal   Peds  Hematology   Anesthesia Other Findings   Reproductive/Obstetrics                            Anesthesia Physical Anesthesia Plan  ASA: II  Anesthesia Plan: MAC   Post-op Pain Management:    Induction: Intravenous  PONV Risk Score and Plan: 2 and TIVA, Midazolam and Treatment may vary due to age or medical condition  Airway Management Planned: Nasal Cannula  Additional Equipment:   Intra-op Plan:   Post-operative Plan:   Informed Consent: I have reviewed the patients History and Physical, chart, labs and discussed the procedure including the risks, benefits and alternatives for the proposed anesthesia with the patient or authorized representative who has indicated his/her understanding and acceptance.       Plan Discussed with: CRNA and Anesthesiologist  Anesthesia Plan Comments:         Anesthesia Quick Evaluation

## 2020-08-08 ENCOUNTER — Ambulatory Visit
Admission: RE | Admit: 2020-08-08 | Discharge: 2020-08-08 | Disposition: A | Discharge status: Home / Self Care | Payer: Medicare Other | Attending: Ophthalmology | Admitting: Ophthalmology

## 2020-08-08 ENCOUNTER — Other Ambulatory Visit: Payer: Self-pay

## 2020-08-08 ENCOUNTER — Encounter
Admission: RE | Disposition: A | Discharge status: Home / Self Care | Payer: Self-pay | Source: Home / Self Care | Attending: Ophthalmology

## 2020-08-08 ENCOUNTER — Ambulatory Visit: Payer: Medicare Other | Admitting: Anesthesiology

## 2020-08-08 ENCOUNTER — Encounter: Payer: Self-pay | Admitting: Ophthalmology

## 2020-08-08 DIAGNOSIS — Z79899 Other long term (current) drug therapy: Secondary | ICD-10-CM | POA: Insufficient documentation

## 2020-08-08 DIAGNOSIS — E7800 Pure hypercholesterolemia, unspecified: Secondary | ICD-10-CM | POA: Insufficient documentation

## 2020-08-08 DIAGNOSIS — M1990 Unspecified osteoarthritis, unspecified site: Secondary | ICD-10-CM | POA: Diagnosis not present

## 2020-08-08 DIAGNOSIS — H401111 Primary open-angle glaucoma, right eye, mild stage: Secondary | ICD-10-CM | POA: Insufficient documentation

## 2020-08-08 DIAGNOSIS — Z7982 Long term (current) use of aspirin: Secondary | ICD-10-CM | POA: Insufficient documentation

## 2020-08-08 DIAGNOSIS — I10 Essential (primary) hypertension: Secondary | ICD-10-CM | POA: Insufficient documentation

## 2020-08-08 DIAGNOSIS — Z7984 Long term (current) use of oral hypoglycemic drugs: Secondary | ICD-10-CM | POA: Diagnosis not present

## 2020-08-08 DIAGNOSIS — E1136 Type 2 diabetes mellitus with diabetic cataract: Secondary | ICD-10-CM | POA: Insufficient documentation

## 2020-08-08 DIAGNOSIS — H2511 Age-related nuclear cataract, right eye: Secondary | ICD-10-CM | POA: Diagnosis not present

## 2020-08-08 HISTORY — PX: CATARACT EXTRACTION W/PHACO: SHX586

## 2020-08-08 HISTORY — DX: Type 2 diabetes mellitus without complications: E11.9

## 2020-08-08 HISTORY — DX: Unspecified osteoarthritis, unspecified site: M19.90

## 2020-08-08 HISTORY — DX: Presence of dental prosthetic device (complete) (partial): Z97.2

## 2020-08-08 LAB — GLUCOSE, CAPILLARY
Glucose-Capillary: 171 mg/dL — ABNORMAL HIGH (ref 70–99)
Glucose-Capillary: 184 mg/dL — ABNORMAL HIGH (ref 70–99)

## 2020-08-08 SURGERY — PHACOEMULSIFICATION, CATARACT, WITH IOL INSERTION
Anesthesia: Monitor Anesthesia Care | Site: Eye | Laterality: Right

## 2020-08-08 MED ORDER — MIDAZOLAM HCL 2 MG/2ML IJ SOLN
INTRAMUSCULAR | Status: DC | PRN
  Administered 2020-08-08: 1 mg via INTRAVENOUS

## 2020-08-08 MED ORDER — FENTANYL CITRATE (PF) 100 MCG/2ML IJ SOLN
INTRAMUSCULAR | Status: DC | PRN
  Administered 2020-08-08: 50 ug via INTRAVENOUS

## 2020-08-08 MED ORDER — LACTATED RINGERS IV SOLN: INTRAVENOUS | Status: DC

## 2020-08-08 MED ORDER — EPINEPHRINE PF 1 MG/ML IJ SOLN
INTRAOCULAR | Status: DC | PRN
  Administered 2020-08-08: 124 mL via OPHTHALMIC

## 2020-08-08 MED ORDER — SODIUM HYALURONATE 23 MG/ML IO SOLN
INTRAOCULAR | Status: DC | PRN
  Administered 2020-08-08: 0.6 mL via INTRAOCULAR

## 2020-08-08 MED ORDER — SODIUM HYALURONATE 10 MG/ML IO SOLN
INTRAOCULAR | Status: DC | PRN
  Administered 2020-08-08: 0.55 mL via INTRAOCULAR

## 2020-08-08 MED ORDER — ARMC OPHTHALMIC DILATING DROPS
1.0000 "application " | OPHTHALMIC | Status: DC | PRN
  Administered 2020-08-08 (×3): 1 via OPHTHALMIC

## 2020-08-08 MED ORDER — TETRACAINE HCL 0.5 % OP SOLN
1.0000 [drp] | OPHTHALMIC | Status: DC | PRN
  Administered 2020-08-08 (×3): 1 [drp] via OPHTHALMIC

## 2020-08-08 MED ORDER — MOXIFLOXACIN HCL 0.5 % OP SOLN
OPHTHALMIC | Status: DC | PRN
  Administered 2020-08-08: 0.2 mL via OPHTHALMIC

## 2020-08-08 MED ORDER — ONDANSETRON HCL 4 MG/2ML IJ SOLN: 4.0000 mg | Freq: Once | INTRAMUSCULAR | Status: DC | PRN

## 2020-08-08 MED ORDER — ACETAMINOPHEN 10 MG/ML IV SOLN: 1000.0000 mg | Freq: Once | INTRAVENOUS | Status: DC | PRN

## 2020-08-08 MED ORDER — LIDOCAINE HCL (PF) 2 % IJ SOLN
INTRAOCULAR | Status: DC | PRN
  Administered 2020-08-08: 1 mL via INTRAOCULAR

## 2020-08-08 SURGICAL SUPPLY — 23 items
CANNULA ANT/CHMB 27G (MISCELLANEOUS) ×2 IMPLANT
CANNULA ANT/CHMB 27GA (MISCELLANEOUS) ×4 IMPLANT
DEVICE INJECT ISTENT W (Stent) IMPLANT
DISSECTOR HYDRO NUCLEUS 50X22 (MISCELLANEOUS) ×2 IMPLANT
GLOVE SURG LX 7.5 STRW (GLOVE) ×2
GLOVE SURG LX STRL 7.5 STRW (GLOVE) ×1 IMPLANT
GLOVE SURG SYN 8.5  E (GLOVE) ×1
GLOVE SURG SYN 8.5 E (GLOVE) ×1 IMPLANT
GLOVE SURG SYN 8.5 PF PI (GLOVE) ×1 IMPLANT
GOWN STRL REUS W/ TWL LRG LVL3 (GOWN DISPOSABLE) ×2 IMPLANT
GOWN STRL REUS W/TWL LRG LVL3 (GOWN DISPOSABLE) ×4
ICLIP (OPHTHALMIC RELATED) ×1 IMPLANT
INJECT ISTENT W (Stent) ×2 IMPLANT
LENS IOL DIOP 22.5 (Intraocular Lens) ×2 IMPLANT
LENS IOL TECNIS MONO 22.5 (Intraocular Lens) IMPLANT
MARKER SKIN DUAL TIP RULER LAB (MISCELLANEOUS) ×2 IMPLANT
PACK DR. KING ARMS (PACKS) ×2 IMPLANT
PACK EYE AFTER SURG (MISCELLANEOUS) ×2 IMPLANT
PACK OPTHALMIC (MISCELLANEOUS) ×2 IMPLANT
SYR 3ML LL SCALE MARK (SYRINGE) ×2 IMPLANT
SYR TB 1ML LUER SLIP (SYRINGE) ×2 IMPLANT
WATER STERILE IRR 250ML POUR (IV SOLUTION) ×2 IMPLANT
WIPE NON LINTING 3.25X3.25 (MISCELLANEOUS) ×2 IMPLANT

## 2020-08-08 NOTE — Anesthesia Postprocedure Evaluation (Signed)
Anesthesia Post Note  Patient: Cindy Singh  Procedure(s) Performed: CATARACT EXTRACTION PHACO AND INTRAOCULAR LENS PLACEMENT (IOC) RIGHT ISTENT INJ W DIABETIC (Right Eye)     Patient location during evaluation: PACU Anesthesia Type: MAC Level of consciousness: awake and alert Pain management: pain level controlled Vital Signs Assessment: post-procedure vital signs reviewed and stable Respiratory status: spontaneous breathing, nonlabored ventilation, respiratory function stable and patient connected to nasal cannula oxygen Cardiovascular status: stable and blood pressure returned to baseline Postop Assessment: no apparent nausea or vomiting Anesthetic complications: no   No complications documented.  Lannah Koike A  Whittney Steenson

## 2020-08-08 NOTE — Anesthesia Procedure Notes (Signed)
Procedure Name: MAC Date/Time: 08/08/2020 8:12 AM Performed by: Silvana Newness, CRNA Pre-anesthesia Checklist: Patient identified, Emergency Drugs available, Suction available, Patient being monitored and Timeout performed Patient Re-evaluated:Patient Re-evaluated prior to induction Oxygen Delivery Method: Nasal cannula Placement Confirmation: positive ETCO2

## 2020-08-08 NOTE — Transfer of Care (Deleted)
Immediate Anesthesia Transfer of Care Note  Patient: Cindy Singh  Procedure(s) Performed: CATARACT EXTRACTION PHACO AND INTRAOCULAR LENS PLACEMENT (IOC) RIGHT ISTENT INJ W DIABETIC (Right Eye)  Patient Location: PACU  Anesthesia Type: MAC  Level of Consciousness: awake, alert  and patient cooperative  Airway and Oxygen Therapy: Patient Spontanous Breathing and Patient connected to supplemental oxygen  Post-op Assessment: Post-op Vital signs reviewed, Patient's Cardiovascular Status Stable, Respiratory Function Stable, Patent Airway and No signs of Nausea or vomiting  Post-op Vital Signs: Reviewed and stable  Complications: No complications documented.

## 2020-08-08 NOTE — H&P (Signed)

## 2020-08-08 NOTE — Op Note (Signed)
OPERATIVE NOTE  Cindy Singh [id number] 08/08/2020  PREOPERATIVE DIAGNOSIS:   1.  Mild PRIMARY open angle glaucoma, right eye. 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  Nuclear sclerotic cataract right eye.  H25.11   POSTOPERATIVE DIAGNOSIS:    same.   PROCEDURE:   1.  Placement of trabecular bypass stent (istent). CPT 0191T  and placement of additional stent  CPT 0376T 2.  Phacoemusification with posterior chamber intraocular lens placement of the right eye  CPT [phone]   LENS: Implant Name Type Inv. Item Serial No. Manufacturer Lot No. LRB No. Used Action  Cindy Singh - [id number] Stent [name] [id number] Right 1 Implanted  LENS IOL DIOP 22.5 - [id number] Intraocular Lens LENS IOL DIOP 22.5 [id number] [employer]  Right 1 Implanted      Procedure(s) with comments: CATARACT EXTRACTION PHACO AND INTRAOCULAR LENS PLACEMENT (IOC) RIGHT ISTENT INJ W DIABETIC (Right) - 5.08 0:50.2  DCB00 +22.5   ULTRASOUND TIME: 0 minutes 50 seconds.  CDE 5.08   SURGEON:  Benay Pillow, MD, MPH  ANESTHESIOLOGIST: Anesthesiologist: [name], [name], MD CRNA: Cindy Newness, CRNA   ANESTHESIA:  MAC and intracameral preservative-free lidocaine 4%.  ESTIMATED BLOOD LOSS: less than 1 mL.   COMPLICATIONS:  None.   DESCRIPTION OF PROCEDURE:  The patient was identified in the holding room and transported to the operating room.   The patient was placed in the supine position under the operating microscope.  The right eye was prepped and draped in the usual sterile ophthalmic fashion.   A 1.0 millimeter clear-corneal paracentesis was made at the 10:30 position. 0.5 ml of preservative-free 1% lidocaine with epinephrine was injected into the anterior chamber.  The anterior chamber was filled with Healon 5 viscoelastic.  A 2.4 millimeter keratome was used to make a near-clear corneal incision at the 8:00 position.   Attention was turned to the istent.  The patients head was turned to  the left and the microscope was tilted to 035 degrees.  Ocular instruments/Glaukos OAL/H2 gonioprism was used with IPC05 (iclip) coupled with Healon 5 on the cornea was used to visualize the trabecular meshwork. The istent was opened and introduced into the eye.  The meshwork was engaged with the tip of the iStent injector and the stent was deployed into Schlemm's canal at 2:00.  The second stent was deployed at 4:00.  The stents were well seated and in good position.  Next, attention was turned to the phacoemulsification A curvilinear capsulorrhexis was made with a cystotome and capsulorrhexis forceps.  Balanced salt solution was used to hydrodissect and hydrodelineate the nucleus.   Phacoemulsification was then used in stop and chop fashion to remove the lens nucleus and epinucleus.  The remaining cortex was then removed using the irrigation and aspiration handpiece. Healon was then placed into the capsular bag to distend it for lens placement.  A lens was then injected into the capsular bag.  The remaining viscoelastic was aspirated.  There was some blood reflux, confirming good placement of the istents.   Wounds were hydrated with balanced salt solution.  The anterior chamber was inflated to a physiologic pressure with balanced salt solution.   Intracameral vigamox 0.1 mL undiluted was injected into the eye and a drop placed onto the ocular surface.  No wound leaks were noted. The patient was taken to the recovery room in stable condition without complications of anesthesia or surgery   Benay Pillow 08/08/2020, 8:42 AM

## 2020-08-08 NOTE — Transfer of Care (Signed)
Immediate Anesthesia Transfer of Care Note  Patient: Cindy Singh  Procedure(s) Performed: CATARACT EXTRACTION PHACO AND INTRAOCULAR LENS PLACEMENT (IOC) RIGHT ISTENT INJ W DIABETIC (Right Eye)  Patient Location: PACU  Anesthesia Type: MAC  Level of Consciousness: awake, alert  and patient cooperative  Airway and Oxygen Therapy: Patient Spontanous Breathing and Patient connected to supplemental oxygen  Post-op Assessment: Post-op Vital signs reviewed, Patient's Cardiovascular Status Stable, Respiratory Function Stable, Patent Airway and No signs of Nausea or vomiting  Post-op Vital Signs: Reviewed and stable  Complications: No complications documented.

## 2020-08-09 ENCOUNTER — Encounter: Payer: Self-pay | Admitting: Ophthalmology

## 2020-09-08 ENCOUNTER — Other Ambulatory Visit: Payer: Self-pay

## 2020-09-08 ENCOUNTER — Other Ambulatory Visit
Admission: RE | Admit: 2020-09-08 | Discharge: 2020-09-08 | Disposition: A | Discharge status: Home / Self Care | Payer: Medicare Other | Source: Ambulatory Visit | Attending: Ophthalmology | Admitting: Ophthalmology

## 2020-09-08 DIAGNOSIS — Z01812 Encounter for preprocedural laboratory examination: Secondary | ICD-10-CM | POA: Diagnosis present

## 2020-09-08 DIAGNOSIS — Z20822 Contact with and (suspected) exposure to covid-19: Secondary | ICD-10-CM | POA: Insufficient documentation

## 2020-09-08 NOTE — Discharge Instructions (Signed)

## 2020-09-09 LAB — SARS CORONAVIRUS 2 (TAT 6-24 HRS): SARS Coronavirus 2: NEGATIVE

## 2020-09-12 ENCOUNTER — Ambulatory Visit: Payer: Medicare Other | Admitting: Anesthesiology

## 2020-09-12 ENCOUNTER — Ambulatory Visit
Admission: RE | Admit: 2020-09-12 | Discharge: 2020-09-12 | Disposition: A | Discharge status: Home / Self Care | Payer: Medicare Other | Attending: Ophthalmology | Admitting: Ophthalmology

## 2020-09-12 ENCOUNTER — Other Ambulatory Visit: Payer: Self-pay

## 2020-09-12 ENCOUNTER — Encounter
Admission: RE | Disposition: A | Discharge status: Home / Self Care | Payer: Self-pay | Source: Home / Self Care | Attending: Ophthalmology

## 2020-09-12 ENCOUNTER — Encounter: Payer: Self-pay | Admitting: Ophthalmology

## 2020-09-12 DIAGNOSIS — E7800 Pure hypercholesterolemia, unspecified: Secondary | ICD-10-CM | POA: Insufficient documentation

## 2020-09-12 DIAGNOSIS — Z7982 Long term (current) use of aspirin: Secondary | ICD-10-CM | POA: Diagnosis not present

## 2020-09-12 DIAGNOSIS — Z9841 Cataract extraction status, right eye: Secondary | ICD-10-CM | POA: Insufficient documentation

## 2020-09-12 DIAGNOSIS — Z7984 Long term (current) use of oral hypoglycemic drugs: Secondary | ICD-10-CM | POA: Diagnosis not present

## 2020-09-12 DIAGNOSIS — Z79899 Other long term (current) drug therapy: Secondary | ICD-10-CM | POA: Insufficient documentation

## 2020-09-12 DIAGNOSIS — I10 Essential (primary) hypertension: Secondary | ICD-10-CM | POA: Insufficient documentation

## 2020-09-12 DIAGNOSIS — E1136 Type 2 diabetes mellitus with diabetic cataract: Secondary | ICD-10-CM | POA: Insufficient documentation

## 2020-09-12 DIAGNOSIS — H401121 Primary open-angle glaucoma, left eye, mild stage: Secondary | ICD-10-CM | POA: Diagnosis not present

## 2020-09-12 DIAGNOSIS — H2512 Age-related nuclear cataract, left eye: Secondary | ICD-10-CM | POA: Insufficient documentation

## 2020-09-12 HISTORY — PX: CATARACT EXTRACTION W/PHACO: SHX586

## 2020-09-12 LAB — GLUCOSE, CAPILLARY
Glucose-Capillary: 150 mg/dL — ABNORMAL HIGH (ref 70–99)
Glucose-Capillary: 144 mg/dL — ABNORMAL HIGH (ref 70–99)

## 2020-09-12 SURGERY — PHACOEMULSIFICATION, CATARACT, WITH IOL INSERTION
Anesthesia: Monitor Anesthesia Care | Site: Eye | Laterality: Left

## 2020-09-12 MED ORDER — ACETAMINOPHEN 325 MG PO TABS: 325.0000 mg | ORAL_TABLET | ORAL | Status: DC | PRN

## 2020-09-12 MED ORDER — SODIUM HYALURONATE 23 MG/ML IO SOLN
INTRAOCULAR | Status: DC | PRN
  Administered 2020-09-12: 0.6 mL via INTRAOCULAR

## 2020-09-12 MED ORDER — MOXIFLOXACIN HCL 0.5 % OP SOLN
OPHTHALMIC | Status: DC | PRN
  Administered 2020-09-12: 0.2 mL via OPHTHALMIC

## 2020-09-12 MED ORDER — TETRACAINE HCL 0.5 % OP SOLN
1.0000 [drp] | OPHTHALMIC | Status: DC | PRN
  Administered 2020-09-12 (×3): 1 [drp] via OPHTHALMIC

## 2020-09-12 MED ORDER — FENTANYL CITRATE (PF) 100 MCG/2ML IJ SOLN
INTRAMUSCULAR | Status: DC | PRN
Start: 2020-09-12 — End: 2020-09-12
  Administered 2020-09-12: 50 ug via INTRAVENOUS

## 2020-09-12 MED ORDER — ACETAMINOPHEN 160 MG/5ML PO SOLN: 325.0000 mg | ORAL | Status: DC | PRN

## 2020-09-12 MED ORDER — EPINEPHRINE PF 1 MG/ML IJ SOLN
INTRAOCULAR | Status: DC | PRN
  Administered 2020-09-12: 89 mL via OPHTHALMIC

## 2020-09-12 MED ORDER — SODIUM HYALURONATE 10 MG/ML IO SOLN
INTRAOCULAR | Status: DC | PRN
  Administered 2020-09-12: 0.55 mL via INTRAOCULAR

## 2020-09-12 MED ORDER — LACTATED RINGERS IV SOLN: INTRAVENOUS | Status: DC

## 2020-09-12 MED ORDER — MIDAZOLAM HCL 2 MG/2ML IJ SOLN
INTRAMUSCULAR | Status: DC | PRN
  Administered 2020-09-12: 1 mg via INTRAVENOUS

## 2020-09-12 MED ORDER — LIDOCAINE HCL (PF) 2 % IJ SOLN
INTRAOCULAR | Status: DC | PRN
  Administered 2020-09-12: 1 mL via INTRAOCULAR

## 2020-09-12 MED ORDER — ARMC OPHTHALMIC DILATING DROPS
1.0000 "application " | OPHTHALMIC | Status: DC | PRN
  Administered 2020-09-12 (×3): 1 via OPHTHALMIC

## 2020-09-12 SURGICAL SUPPLY — 23 items
CANNULA ANT/CHMB 27G (MISCELLANEOUS) ×2 IMPLANT
CANNULA ANT/CHMB 27GA (MISCELLANEOUS) ×6 IMPLANT
DEVICE INJECT ISTENT W (Stent) IMPLANT
DISSECTOR HYDRO NUCLEUS 50X22 (MISCELLANEOUS) ×3 IMPLANT
GLOVE SURG LX 7.5 STRW (GLOVE) ×2
GLOVE SURG LX STRL 7.5 STRW (GLOVE) ×1 IMPLANT
GLOVE SURG SYN 8.5  E (GLOVE) ×2
GLOVE SURG SYN 8.5 E (GLOVE) ×1 IMPLANT
GLOVE SURG SYN 8.5 PF PI (GLOVE) ×1 IMPLANT
GOWN STRL REUS W/ TWL LRG LVL3 (GOWN DISPOSABLE) ×2 IMPLANT
GOWN STRL REUS W/TWL LRG LVL3 (GOWN DISPOSABLE) ×6
ICLIP (OPHTHALMIC RELATED) ×2 IMPLANT
INJECT ISTENT W (Stent) ×3 IMPLANT
LENS IOL DIOP 23.0 (Intraocular Lens) ×3 IMPLANT
LENS IOL TECNIS MONO 23.0 (Intraocular Lens) IMPLANT
MARKER SKIN DUAL TIP RULER LAB (MISCELLANEOUS) ×3 IMPLANT
PACK DR. KING ARMS (PACKS) ×3 IMPLANT
PACK EYE AFTER SURG (MISCELLANEOUS) ×3 IMPLANT
PACK OPTHALMIC (MISCELLANEOUS) ×3 IMPLANT
SYR 3ML LL SCALE MARK (SYRINGE) ×3 IMPLANT
SYR TB 1ML LUER SLIP (SYRINGE) ×3 IMPLANT
WATER STERILE IRR 250ML POUR (IV SOLUTION) ×3 IMPLANT
WIPE NON LINTING 3.25X3.25 (MISCELLANEOUS) ×3 IMPLANT

## 2020-09-12 NOTE — Anesthesia Postprocedure Evaluation (Signed)
Anesthesia Post Note  Patient: Cindy Singh  Procedure(s) Performed: CATARACT EXTRACTION PHACO AND INTRAOCULAR LENS PLACEMENT (IOC) ISTENT INJ W LEFT DIABETIC 2.69  00:31.0 (Left Eye)     Patient location during evaluation: PACU Anesthesia Type: MAC Level of consciousness: awake and alert Pain management: pain level controlled Vital Signs Assessment: post-procedure vital signs reviewed and stable Respiratory status: spontaneous breathing, nonlabored ventilation, respiratory function stable and patient connected to nasal cannula oxygen Cardiovascular status: stable and blood pressure returned to baseline Postop Assessment: no apparent nausea or vomiting Anesthetic complications: no   No complications documented.  Alisa Graff

## 2020-09-12 NOTE — Transfer of Care (Signed)
Immediate Anesthesia Transfer of Care Note  Patient: Cindy Singh  Procedure(s) Performed: CATARACT EXTRACTION PHACO AND INTRAOCULAR LENS PLACEMENT (IOC) ISTENT INJ W LEFT DIABETIC (Left Eye)  Patient Location: PACU  Anesthesia Type: MAC  Level of Consciousness: awake, alert  and patient cooperative  Airway and Oxygen Therapy: Patient Spontanous Breathing and Patient connected to supplemental oxygen  Post-op Assessment: Post-op Vital signs reviewed, Patient's Cardiovascular Status Stable, Respiratory Function Stable, Patent Airway and No signs of Nausea or vomiting  Post-op Vital Signs: Reviewed and stable  Complications: No complications documented.

## 2020-09-12 NOTE — Anesthesia Procedure Notes (Signed)
Procedure Name: MAC Date/Time: 09/12/2020 7:32 AM Performed by: Silvana Newness, CRNA Pre-anesthesia Checklist: Patient identified, Emergency Drugs available, Suction available, Patient being monitored and Timeout performed Patient Re-evaluated:Patient Re-evaluated prior to induction Oxygen Delivery Method: Nasal cannula Placement Confirmation: positive ETCO2

## 2020-09-12 NOTE — Anesthesia Preprocedure Evaluation (Signed)
Anesthesia Evaluation  Patient identified by MRN, date of birth, ID band Patient awake    Reviewed: Allergy & Precautions, H&P , NPO status , Patient's Chart, lab work & pertinent test results, reviewed documented beta blocker date and time   Airway Mallampati: II  TM Distance: >3 FB Neck ROM: full    Dental  (+) Upper Dentures, Lower Dentures   Pulmonary neg pulmonary ROS,    Pulmonary exam normal breath sounds clear to auscultation       Cardiovascular Exercise Tolerance: Good hypertension,  Rhythm:regular Rate:Normal     Neuro/Psych negative neurological ROS  negative psych ROS   GI/Hepatic negative GI ROS, Neg liver ROS,   Endo/Other  diabetes, Type 2  Renal/GU negative Renal ROS  negative genitourinary   Musculoskeletal   Abdominal   Peds  Hematology negative hematology ROS (+)   Anesthesia Other Findings   Reproductive/Obstetrics negative OB ROS                             Anesthesia Physical Anesthesia Plan  ASA: II  Anesthesia Plan: MAC   Post-op Pain Management:    Induction:   PONV Risk Score and Plan: 2 and Treatment may vary due to age or medical condition  Airway Management Planned:   Additional Equipment:   Intra-op Plan:   Post-operative Plan:   Informed Consent: I have reviewed the patients History and Physical, chart, labs and discussed the procedure including the risks, benefits and alternatives for the proposed anesthesia with the patient or authorized representative who has indicated his/her understanding and acceptance.     Dental Advisory Given  Plan Discussed with: CRNA  Anesthesia Plan Comments:         Anesthesia Quick Evaluation

## 2020-09-12 NOTE — H&P (Signed)

## 2020-09-12 NOTE — Op Note (Signed)
OPERATIVE NOTE  Cindy Singh [id number] 09/12/2020  PREOPERATIVE DIAGNOSIS:   1.  Mild  PRIMARY open angle glaucoma, left eye. S81.1031  2.  Nuclear sclerotic cataract left eye.  H25.12   POSTOPERATIVE DIAGNOSIS:    same.   PROCEDURE:   1.  Placement of trabecular bypass stent (istent). CPT 0191T  and placement of additional stent, LEFT EYE  CPT 0376T 2.  Phacoemusification with posterior chamber intraocular lens placement of the left eye  CPT [phone]   LENS: Implant Name Type Inv. Item Serial No. Manufacturer Lot No. LRB No. Used Action  Cindy Singh - [id number] Stent Cindy Singh [id number] [employer]  Left 1 Implanted  LENS IOL DIOP 23.0 - [id number] Intraocular Lens LENS IOL DIOP 23.0 [id number] [employer]   Left 1 Implanted      Procedure(s): CATARACT EXTRACTION PHACO AND INTRAOCULAR LENS PLACEMENT (IOC) ISTENT INJ W LEFT DIABETIC 2.69  00:31.0 (Left)  DCB00 +23.0   ULTRASOUND TIME: 0 minutes 31 seconds.  CDE 2.69   SURGEON:  Benay Pillow, MD, MPH  ANESTHESIOLOGIST: Anesthesiologist: Alisa Graff, MD CRNA: Silvana Newness, CRNA   ANESTHESIA:  MAC and intracameral preservative-free intracameral lidocaine 4%.  ESTIMATED BLOOD LOSS: less than 1 mL.   COMPLICATIONS:  None.   DESCRIPTION OF PROCEDURE:  The patient was identified in the holding room and transported to the operating room.  The patient was placed in the supine position under the operating microscope.  The left eye was prepped and draped in the usual sterile ophthalmic fashion.   A 1.0 millimeter clear-corneal paracentesis was made at the 4:30 position. 0.5 ml of preservative-free 1% lidocaine with epinephrine was injected into the anterior chamber.  The anterior chamber was filled with Healon 5 viscoelastic.  A 2.4 millimeter keratome was used to make a near-clear corneal incision at the 2:00 position.   Attention was turned to the istent.  The patients head was turned to the  left and the microscope was tilted to 035 degrees.  Ocular instruments/[employer] OAL/H2 gonioprism was used with IPC05 (iclip) coupled with Healon 5 on the cornea was used to visualize the trabecular meshwork. The istent was opened and introduced into the eye.  The meshwork was engaged with the tip of the iStent injector and the stent was deployed into Schlemm's canal at 10:30.  The second stent was deployed at 8:00.  The stents were well seated and in good position.  Next, attention was turned to the phacoemulsification A curvilinear capsulorrhexis was made with a cystotome and capsulorrhexis forceps.  Balanced salt solution was used to hydrodissect and hydrodelineate the nucleus.   Phacoemulsification was then used in stop and chop fashion to remove the lens nucleus and epinucleus.  The remaining cortex was then removed using the irrigation and aspiration handpiece. Healon was then placed into the capsular bag to distend it for lens placement.  A lens was then injected into the capsular bag.  The remaining viscoelastic was aspirated.   Wounds were hydrated with balanced salt solution.  The anterior chamber was inflated to a physiologic pressure with balanced salt solution.   Intracameral vigamox 0.1 mL undiluted was injected into the eye and a drop placed onto the ocular surface.  No wound leaks were noted.  Protective glasses were placed on the patient.  The patient was taken to the recovery room in stable condition without complications of anesthesia or surgery   Benay Pillow 09/12/2020, 8:01 AM

## 2020-09-13 ENCOUNTER — Encounter: Payer: Self-pay | Admitting: Ophthalmology

## 2020-12-19 DIAGNOSIS — R0981 Nasal congestion: Secondary | ICD-10-CM | POA: Insufficient documentation

## 2021-02-08 DIAGNOSIS — C4491 Basal cell carcinoma of skin, unspecified: Secondary | ICD-10-CM

## 2021-02-08 HISTORY — DX: Basal cell carcinoma of skin, unspecified: C44.91

## 2021-02-21 ENCOUNTER — Other Ambulatory Visit: Payer: Self-pay

## 2021-02-21 ENCOUNTER — Ambulatory Visit (INDEPENDENT_AMBULATORY_CARE_PROVIDER_SITE_OTHER): Payer: Medicare Other | Admitting: Dermatology

## 2021-02-21 ENCOUNTER — Encounter: Payer: Self-pay | Admitting: Dermatology

## 2021-02-21 DIAGNOSIS — L814 Other melanin hyperpigmentation: Secondary | ICD-10-CM

## 2021-02-21 DIAGNOSIS — L821 Other seborrheic keratosis: Secondary | ICD-10-CM | POA: Diagnosis not present

## 2021-02-21 DIAGNOSIS — C44319 Basal cell carcinoma of skin of other parts of face: Secondary | ICD-10-CM

## 2021-02-21 DIAGNOSIS — L578 Other skin changes due to chronic exposure to nonionizing radiation: Secondary | ICD-10-CM

## 2021-02-21 NOTE — Progress Notes (Signed)
   New Patient Visit  Subjective  Cindy Singh is a 80 y.o. female who presents for the following: Skin Problem (New pt presents with biopsy proven BCC on the L cheek, pt was referred here by here PCP Jenny Reichmann Doughton for treatment). Pt report this is her second time having a BCC on the L cheek  Daughter with patient contributing to history   The following portions of the chart were reviewed this encounter and updated as appropriate:       Review of Systems:  No other skin or systemic complaints except as noted in HPI or Assessment and Plan.  Objective  Well appearing patient in no apparent distress; mood and affect are within normal limits.  A focused examination was performed including face,lower legs. Relevant physical exam findings are noted in the Assessment and Plan.  Objective  L zygoma: 1.3 cm Pink plaque       Assessment & Plan  Basal cell carcinoma (BCC) of skin of other part of face L zygoma  Biopsy proven BCC nodular pattern  Recommend Mohs surgery for treatment   Total body mole checks recommended every 6 months, due to history of skin cancers    Other Related Procedures Ambulatory referral to Dermatology Seborrheic Keratoses R lower leg, face  - Stuck-on, waxy, tan-brown papules and plaques  - Discussed benign etiology and prognosis. - Observe - Call for any changes  Melanocytic Nevi face - Tan-brown and/or pink-flesh-colored symmetric macules and papules - Benign appearing on exam today - Observation - Call clinic for new or changing moles - Recommend daily use of broad spectrum spf 30+ sunscreen to sun-exposed areas.    Return in about 2 months (around 04/23/2021) for TBSE .  I, Marye Round, CMA, am acting as scribe for Forest Gleason, MD .

## 2021-02-21 NOTE — Patient Instructions (Addendum)
Melanoma ABCDEs  Melanoma is the most dangerous type of skin cancer, and is the leading cause of death from skin disease.  You are more likely to develop melanoma if you:  Have light-colored skin, light-colored eyes, or red or blond hair  Spend a lot of time in the sun  Tan regularly, either outdoors or in a tanning bed  Have had blistering sunburns, especially during childhood  Have a close family member who has had a melanoma  Have atypical moles or large birthmarks  Early detection of melanoma is key since treatment is typically straightforward and cure rates are extremely high if we catch it early.   The first sign of melanoma is often a change in a mole or a new dark spot.  The ABCDE system is a way of remembering the signs of melanoma.  A for asymmetry:  The two halves do not match. B for border:  The edges of the growth are irregular. C for color:  A mixture of colors are present instead of an even brown color. D for diameter:  Melanomas are usually (but not always) greater than 34mm - the size of a pencil eraser. E for evolution:  The spot keeps changing in size, shape, and color.  Please check your skin once per month between visits. You can use a small mirror in front and a large mirror behind you to keep an eye on the back side or your body.   If you see any new or changing lesions before your next follow-up, please call to schedule a visit.  Please continue daily skin protection including broad spectrum sunscreen SPF 30+ to sun-exposed areas, reapplying every 2 hours as needed when you're outdoors.   Recommend taking Heliocare sun protection supplement daily in sunny weather for additional sun protection. For maximum protection on the sunniest days, you can take up to 2 capsules of regular Heliocare OR take 1 capsule of Heliocare Ultra. For prolonged exposure (such as a full day in the sun), you can repeat your dose of the supplement 4 hours after your first dose. Heliocare  can be purchased at [facility] or at [email or web address].

## 2021-02-21 NOTE — Progress Notes (Signed)
   New Patient Visit  Subjective  Cindy Singh is a 80 y.o. female who presents for the following: Skin Problem (New pt presents with biopsy proven BCC on the L cheek, pt was referred here by here PCP Jenny Reichmann Doughton for treatment).  She also notes a spot on her right leg. No other spots she is aware of.  The following portions of the chart were reviewed this encounter and updated as appropriate:   Tobacco  Allergies  Meds  Problems  Med Hx  Surg Hx  Fam Hx      Review of Systems:  No other skin or systemic complaints except as noted in HPI or Assessment and Plan.  Objective  Well appearing patient in no apparent distress; mood and affect are within normal limits.  A focused examination was performed including face, ears, neck, upper chest, right leg. Relevant physical exam findings are noted in the Assessment and Plan.  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 She prefers [facility].  - Recommend regular full body skin exams - Recommend daily broad spectrum sunscreen SPF 30+ to sun-exposed areas, reapply every 2 hours as needed.  - Call if any new or changing lesions are noted between office visits mole checks recommended every 6 months, due to history of skin cancers  - Recommend Nicotinamide 500mg  twice per day to lower risk of non-melanoma skin cancer by approximately 25%.    Other Related Procedures Ambulatory referral to Dermatology  Actinic Damage - chronic, secondary to cumulative UV radiation exposure/sun exposure over time - diffuse scaly erythematous macules with underlying dyspigmentation - Recommend daily broad spectrum sunscreen SPF 30+ to sun-exposed areas, reapply every 2 hours as needed.  - Call for new or changing lesions.  Seborrheic Keratoses - Stuck-on,  waxy, tan-brown papules and plaques  - Discussed benign etiology and prognosis. - Observe - Call for any changes  Lentigines - Scattered tan macules - Due to sun exposure - Benign-appering, observe - Recommend daily broad spectrum sunscreen SPF 30+ to sun-exposed areas, reapply every 2 hours as needed. - Call for any changes  Return in about 2 months (around 04/23/2021) for TBSE .    Forest Gleason, MD

## 2021-04-03 DIAGNOSIS — E538 Deficiency of other specified B group vitamins: Secondary | ICD-10-CM | POA: Insufficient documentation

## 2021-04-27 ENCOUNTER — Ambulatory Visit (INDEPENDENT_AMBULATORY_CARE_PROVIDER_SITE_OTHER): Payer: Medicare Other | Admitting: Dermatology

## 2021-04-27 ENCOUNTER — Encounter: Payer: Self-pay | Admitting: Dermatology

## 2021-04-27 ENCOUNTER — Other Ambulatory Visit: Payer: Self-pay

## 2021-04-27 DIAGNOSIS — L578 Other skin changes due to chronic exposure to nonionizing radiation: Secondary | ICD-10-CM

## 2021-04-27 DIAGNOSIS — D2361 Other benign neoplasm of skin of right upper limb, including shoulder: Secondary | ICD-10-CM

## 2021-04-27 DIAGNOSIS — L821 Other seborrheic keratosis: Secondary | ICD-10-CM

## 2021-04-27 DIAGNOSIS — L813 Cafe au lait spots: Secondary | ICD-10-CM

## 2021-04-27 DIAGNOSIS — Z1283 Encounter for screening for malignant neoplasm of skin: Secondary | ICD-10-CM

## 2021-04-27 DIAGNOSIS — D229 Melanocytic nevi, unspecified: Secondary | ICD-10-CM

## 2021-04-27 DIAGNOSIS — D239 Other benign neoplasm of skin, unspecified: Secondary | ICD-10-CM

## 2021-04-27 DIAGNOSIS — L814 Other melanin hyperpigmentation: Secondary | ICD-10-CM

## 2021-04-27 DIAGNOSIS — Z85828 Personal history of other malignant neoplasm of skin: Secondary | ICD-10-CM | POA: Diagnosis not present

## 2021-04-27 DIAGNOSIS — D1800 Hemangioma unspecified site: Secondary | ICD-10-CM

## 2021-04-27 NOTE — Progress Notes (Signed)
   Follow-Up Visit   Subjective  Cindy Singh is a 80 y.o. female who presents for the following: Follow-up (Patient states she just had mohs surgery last week. She reports she is doing good. Patient here for tbse. She has history of bcc. And she reports no new concerns today. ).  Patient here for full body skin exam and skin cancer screening.   The following portions of the chart were reviewed this encounter and updated as appropriate:  Tobacco  Allergies  Meds  Problems  Med Hx  Surg Hx  Fam Hx       Objective  Well appearing patient in no apparent distress; mood and affect are within normal limits.  A full examination was performed including scalp, head, eyes, ears, nose, lips, neck, chest, axillae, abdomen, back, buttocks, bilateral upper extremities, bilateral lower extremities, hands, feet, fingers, toes, fingernails, and toenails. All findings within normal limits unless otherwise noted below.  Objective  Right Upper Arm: 0.6 cm blue smooth papule   Assessment & Plan  Blue nevus Right Upper Arm  Benign-appearing.  Unchanged by history. Observation.  Call clinic for new or changing lesions.  Recommend daily use of broad spectrum spf 30+ sunscreen to sun-exposed areas.    Lentigines - Scattered tan macules - Due to sun exposure - Benign-appering, observe - Recommend daily broad spectrum sunscreen SPF 30+ to sun-exposed areas, reapply every 2 hours as needed. - Call for any changes  Seborrheic Keratoses - Stuck-on, waxy, tan-brown papules and/or plaques  - Benign-appearing - Discussed benign etiology and prognosis. 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   Hemangiomas - Red papules - Discussed benign nature - Observe - Call for any changes  Actinic Damage - Chronic condition, secondary to cumulative UV/sun exposure - diffuse scaly erythematous macules with underlying dyspigmentation - Recommend daily broad spectrum sunscreen SPF 30+ to sun-exposed areas, reapply every 2 hours as needed.  - Staying in the shade or wearing long sleeves, sun glasses (UVA+UVB protection) and wide brim hats (4-inch brim around the entire circumference of the hat) are also recommended for sun protection.  - Call for new or changing lesions.  History of Basal Cell Carcinoma of the Skin - No evidence of recurrence today on left zygoma healing scar ( mohs 2022) - Recommend regular full body skin exams - Recommend daily broad spectrum sunscreen SPF 30+ to sun-exposed areas, reapply every 2 hours as needed.  - Call if any new or changing lesions are noted between office visits   Skin cancer screening performed today.  Return in about 6 months (around 10/28/2021) for tbse.  I, Ruthell Rummage, CMA, am acting as scribe for Forest Gleason, MD.  Documentation: I have reviewed the above documentation for accuracy and completeness, and I agree with the above.  Forest Gleason, MD

## 2021-04-27 NOTE — Patient Instructions (Addendum)
Melanoma ABCDEs  Melanoma is the most dangerous type of skin cancer, and is the leading cause of death from skin disease.  You are more likely to develop melanoma if you:  Have light-colored skin, light-colored eyes, or red or blond hair  Spend a lot of time in the sun  Tan regularly, either outdoors or in a tanning bed  Have had blistering sunburns, especially during childhood  Have a close family member who has had a melanoma  Have atypical moles or large birthmarks  Early detection of melanoma is key since treatment is typically straightforward and cure rates are extremely high if we catch it early.   The first sign of melanoma is often a change in a mole or a new dark spot.  The ABCDE system is a way of remembering the signs of melanoma.  A for asymmetry:  The two halves do not match. B for border:  The edges of the growth are irregular. C for color:  A mixture of colors are present instead of an even brown color. D for diameter:  Melanomas are usually (but not always) greater than 56mm - the size of a pencil eraser. E for evolution:  The spot keeps changing in size, shape, and color.  Please check your skin once per month between visits. You can use a small mirror in front and a large mirror behind you to keep an eye on the back side or your body.   If you see any new or changing lesions before your next follow-up, please call to schedule a visit.  Please continue daily skin protection including broad spectrum sunscreen SPF 30+ to sun-exposed areas, reapplying every 2 hours as needed when you're outdoors.   Staying in the shade or wearing long sleeves, sun glasses (UVA+UVB protection) and wide brim hats (4-inch brim around the entire circumference of the hat) are also recommended for sun protection.   If you have any questions or concerns for your doctor, please call our main line at [phone] and press option 4 to reach your doctor's medical assistant. If no one answers,  please leave a voicemail as directed and we will return your call as soon as possible. Messages left after 4 pm will be answered the following business day.   You may also send [place] a message via [place]. We typically respond to MyChart messages within 1-2 business days.  For prescription refills, please ask your pharmacy to contact our office. Our fax number is [phone].  If you have an urgent issue when the clinic is closed that cannot wait until the next business day, you can page your doctor at the number below.    Please note that while we do our best to be available for urgent issues outside of office hours, we are not available 24/7.   If you have an urgent issue and are unable to reach [place], you may choose to seek medical care at your doctor's office, retail clinic, urgent care center, or emergency room.  If you have a medical emergency, please immediately call 911 or go to the emergency department.  Pager Numbers  - Dr. Nehemiah Massed: [phone]  - Dr. Laurence Ferrari: [phone]  - Dr. Nicole Kindred: [phone]  In the event of inclement weather, please call our main line at [phone] for an update on the status of any delays or closures.  Dermatology Medication Tips: Please keep the boxes that topical medications come in in order to help keep track of the instructions about where and how  to use these. Pharmacies typically print the medication instructions only on the boxes and not directly on the medication tubes.   If your medication is too expensive, please contact our office at [phone] option 4 or send [place] a message through [place].   We are unable to tell what your co-pay for medications will be in advance as this is different depending on your insurance coverage. However, we may be able to find a substitute medication at lower cost or fill out paperwork to get insurance to cover a needed medication.   If a prior authorization is required to get your medication covered by your  insurance company, please allow [place] 1-2 business days to complete this process.  Drug prices often vary depending on where the prescription is filled and some pharmacies may offer cheaper prices.  The website www.goodrx.com contains coupons for medications through different pharmacies. The prices here do not account for what the cost may be with help from insurance (it may be cheaper with your insurance), but the website can give you the price if you did not use any insurance.  - You can print the associated coupon and take it with your prescription to the pharmacy.  - You may also stop by our office during regular business hours and pick up a GoodRx coupon card.  - If you need your prescription sent electronically to a different pharmacy, notify our office through [facility] or by phone at [phone] option 4.  Seborrheic Keratosis or (wisdom spots)  What causes seborrheic keratoses? Seborrheic keratoses are harmless, common skin growths that first appear during adult life.  As time goes by, more growths appear.  Some people may develop a large number of them.  Seborrheic keratoses appear on both covered and uncovered body parts.  They are not caused by sunlight.  The tendency to develop seborrheic keratoses can be inherited.  They vary in color from skin-colored to gray, brown, or even black.  They can be either smooth or have a rough, warty surface.   Seborrheic keratoses are superficial and look as if they were stuck on the skin.  Under the microscope this type of keratosis looks like layers upon layers of skin.  That is why at times the top layer may seem to fall off, but the rest of the growth remains and re-grows.    Treatment Seborrheic keratoses do not need to be treated, but can easily be removed in the office.  Seborrheic keratoses often cause symptoms when they rub on clothing or jewelry.  Lesions can be in the way of shaving.  If they become inflamed, they can cause itching,  soreness, or burning.  Removal of a seborrheic keratosis can be accomplished by freezing, burning, or surgery. If any spot bleeds, scabs, or grows rapidly, please return to have it checked, as these can be an indication of a skin cancer.

## 2021-07-25 IMAGING — MG DIGITAL SCREENING BILAT W/ TOMO W/ CAD
8 series · 8 of 24 positions shown · non-contrast
Comparison: Previous exam(s).

CLINICAL DATA: Screening.

EXAM:
DIGITAL SCREENING BILATERAL MAMMOGRAM WITH TOMO AND CAD

[L CC synth-2D]
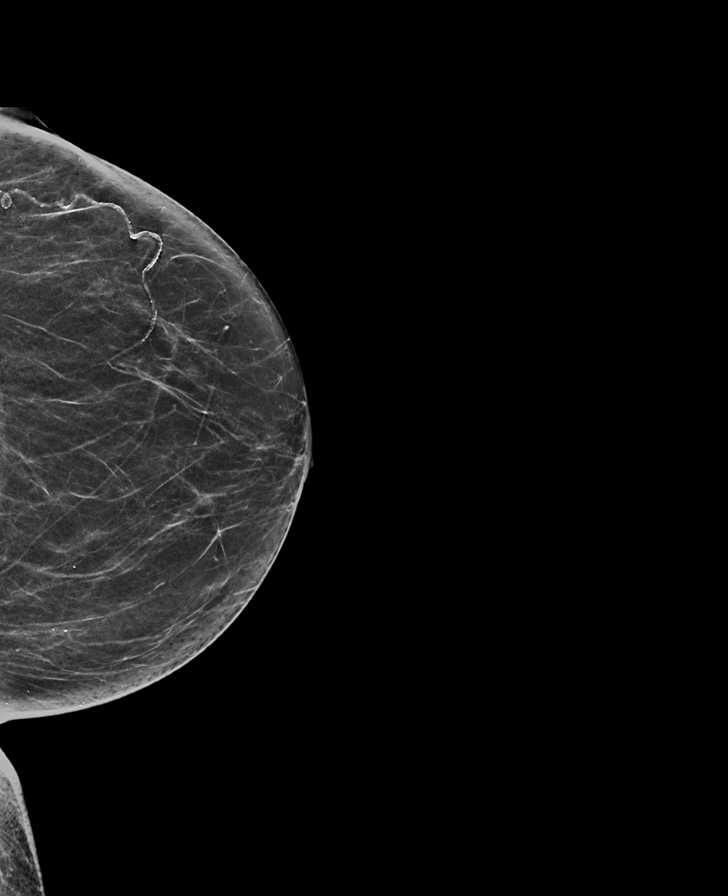

[R MLO synth-2D]
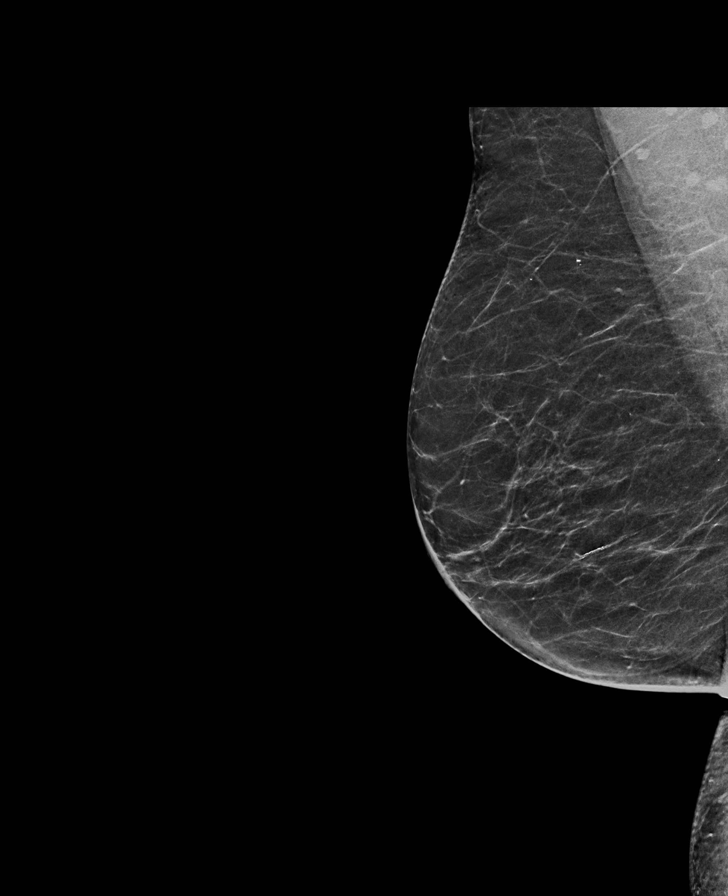

[R CC synth-2D]
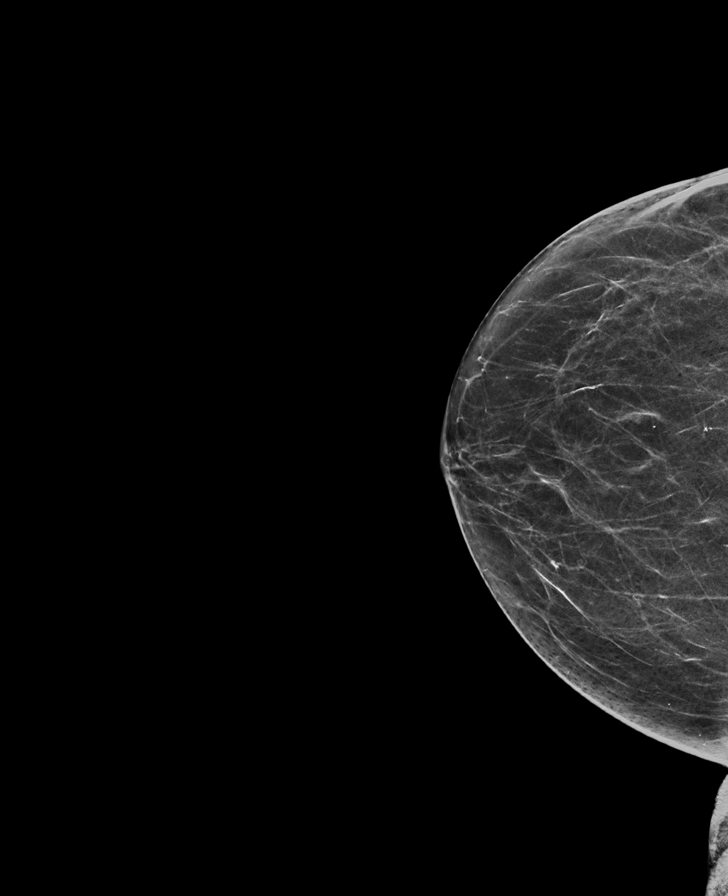

[L MLO synth-2D]
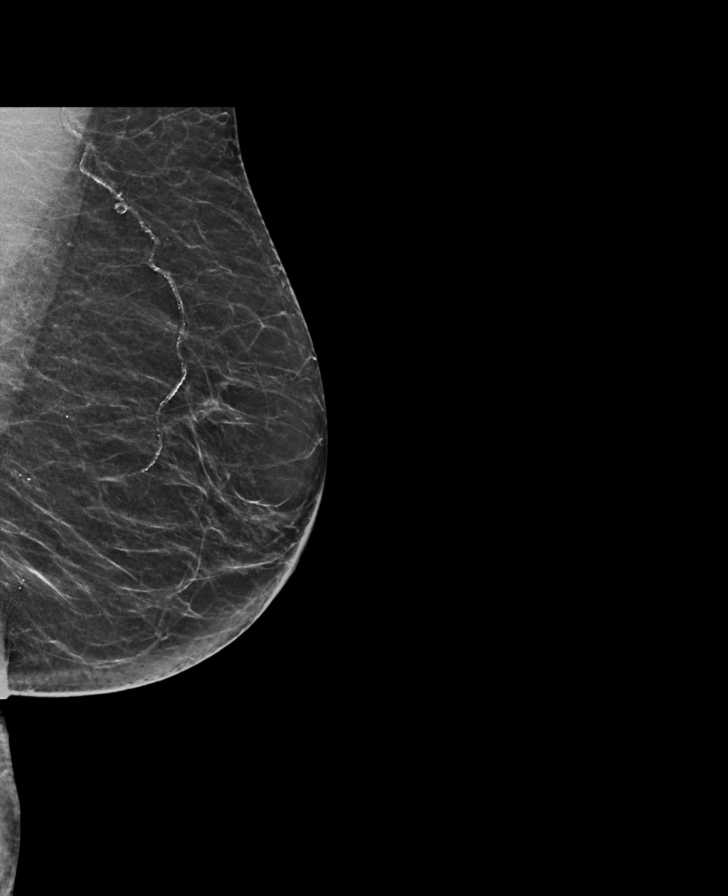

[R MLO tomo · tomo slice 30/59.0]
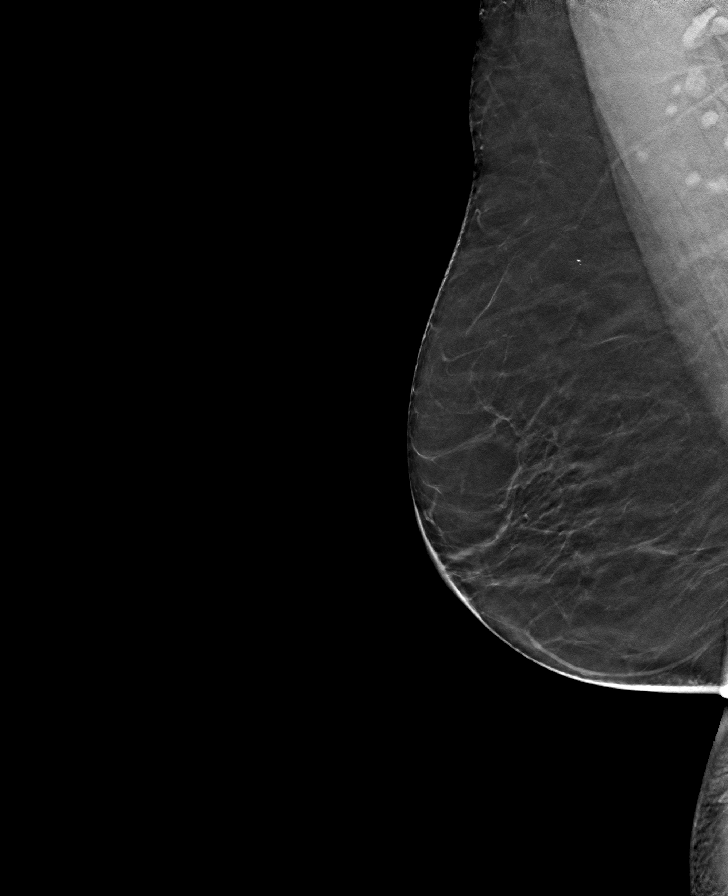

[L MLO tomo · tomo slice 30/59.0]
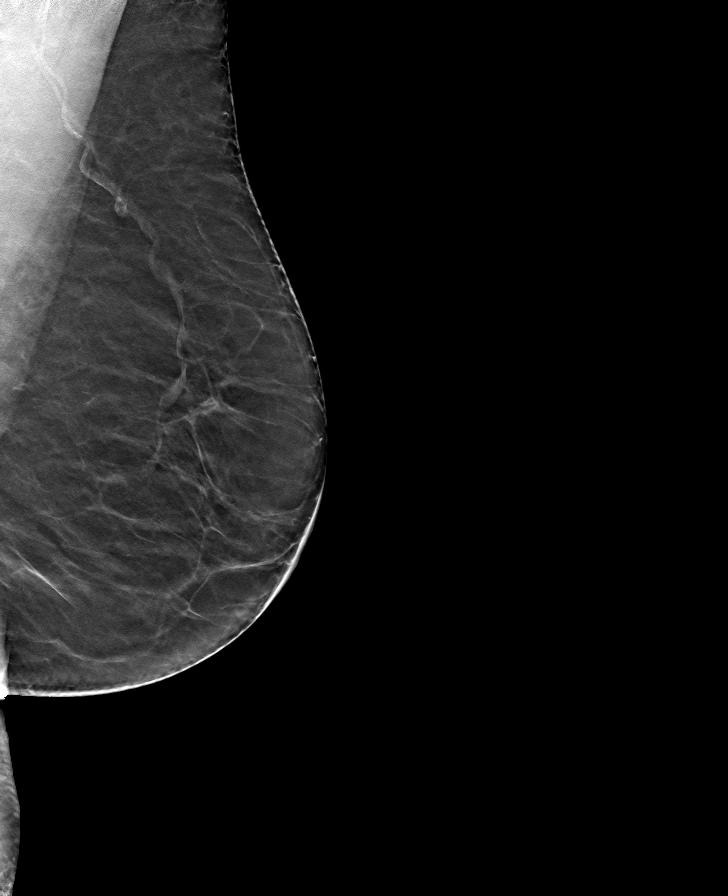

[R CC tomo · tomo slice 30/59.0]
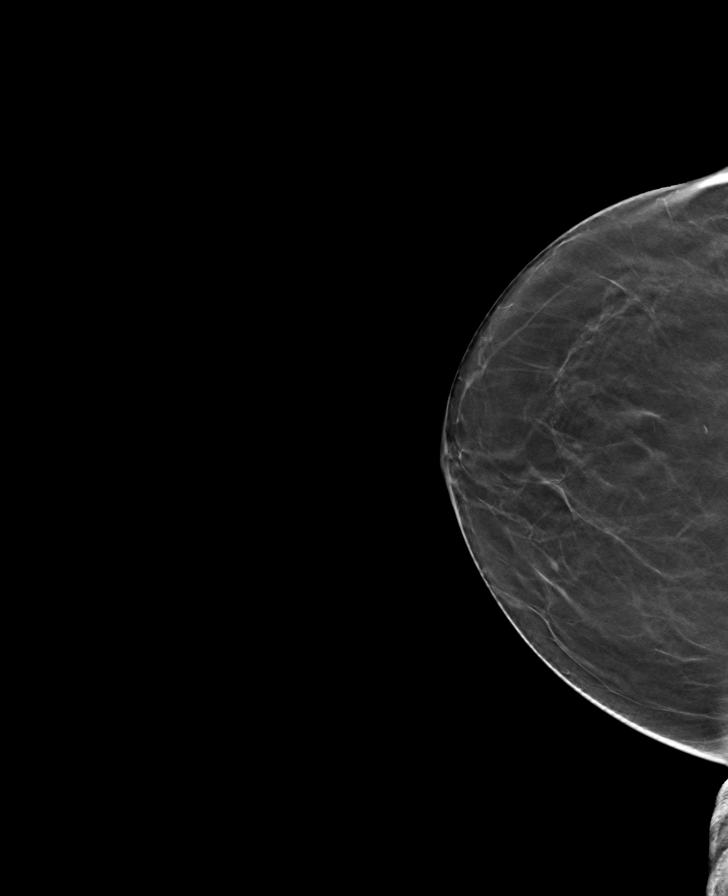

[L CC tomo · tomo slice 30/59.0]
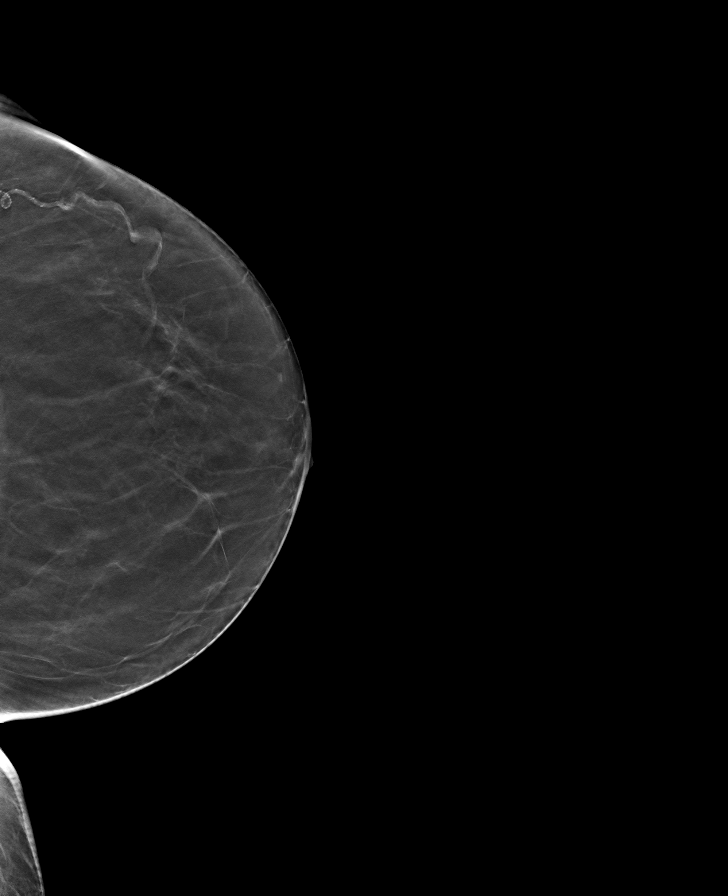

[8 of 24 positions shown; findings below may reference images not displayed]

ACR Breast Density Category b: There are scattered areas of
fibroglandular density.
FINDINGS: There are no findings suspicious for malignancy. Images were
processed with CAD.
IMPRESSION: No mammographic evidence of malignancy. A result letter of this
screening mammogram will be mailed directly to the patient.

RECOMMENDATION:
Screening mammogram in one year. (Code:[id number])

BI-RADS CATEGORY  1: Negative.

## 2021-10-25 ENCOUNTER — Encounter: Payer: Self-pay | Admitting: Dermatology

## 2021-10-25 ENCOUNTER — Other Ambulatory Visit: Payer: Self-pay

## 2021-10-25 ENCOUNTER — Ambulatory Visit (INDEPENDENT_AMBULATORY_CARE_PROVIDER_SITE_OTHER): Payer: Medicare Other | Admitting: Dermatology

## 2021-10-25 DIAGNOSIS — L821 Other seborrheic keratosis: Secondary | ICD-10-CM

## 2021-10-25 DIAGNOSIS — D1800 Hemangioma unspecified site: Secondary | ICD-10-CM

## 2021-10-25 DIAGNOSIS — Z1283 Encounter for screening for malignant neoplasm of skin: Secondary | ICD-10-CM

## 2021-10-25 DIAGNOSIS — D229 Melanocytic nevi, unspecified: Secondary | ICD-10-CM

## 2021-10-25 DIAGNOSIS — L813 Cafe au lait spots: Secondary | ICD-10-CM

## 2021-10-25 DIAGNOSIS — D239 Other benign neoplasm of skin, unspecified: Secondary | ICD-10-CM

## 2021-10-25 DIAGNOSIS — R21 Rash and other nonspecific skin eruption: Secondary | ICD-10-CM

## 2021-10-25 DIAGNOSIS — Z85828 Personal history of other malignant neoplasm of skin: Secondary | ICD-10-CM

## 2021-10-25 DIAGNOSIS — L578 Other skin changes due to chronic exposure to nonionizing radiation: Secondary | ICD-10-CM | POA: Diagnosis not present

## 2021-10-25 DIAGNOSIS — L814 Other melanin hyperpigmentation: Secondary | ICD-10-CM

## 2021-10-25 DIAGNOSIS — D2361 Other benign neoplasm of skin of right upper limb, including shoulder: Secondary | ICD-10-CM

## 2021-10-25 MED ORDER — KETOCONAZOLE 2 % EX CREA: 1.0000 "application " | TOPICAL_CREAM | Freq: Two times a day (BID) | CUTANEOUS | 1 refills | Status: AC

## 2021-10-25 MED ORDER — HYDROCORTISONE 2.5 % EX CREA: TOPICAL_CREAM | Freq: Two times a day (BID) | CUTANEOUS | 0 refills | Status: AC

## 2021-10-25 MED ORDER — HYDROCORTISONE 2.5 % EX CREA: TOPICAL_CREAM | Freq: Two times a day (BID) | CUTANEOUS | 0 refills | Status: DC

## 2021-10-25 NOTE — Progress Notes (Signed)
Follow-Up Visit   Subjective  Cindy Singh is a 80 y.o. female who presents for the following: Follow-up (Patient here today for 6 month tbse. She reports a spot at left corner of mouth she would like checked. Denies other concerns at this time. ).  Patient here for full body skin exam and skin cancer screening.  The following portions of the chart were reviewed this encounter and updated as appropriate:  Tobacco  Allergies  Meds  Problems  Med Hx  Surg Hx  Fam Hx     Review of Systems: No other skin or systemic complaints except as noted in HPI or Assessment and Plan.   Objective  Well appearing patient in no apparent distress; mood and affect are within normal limits.  A full examination was performed including scalp, head, eyes, ears, nose, lips, neck, chest, axillae, abdomen, back, buttocks, bilateral upper extremities, bilateral lower extremities, hands, feet, fingers, toes, fingernails, and toenails. All findings within normal limits unless otherwise noted below.  Right Upper Arm - Anterior Blue macule  left lower cutaneous lip Scaly pink patch with slightly raised border  Assessment & Plan  Blue nevus Right Upper Arm - Anterior  Benign-appearing.  Observation.  Call clinic for new or changing lesions.    Rash and other nonspecific skin eruption left lower cutaneous lip  Dermatitis vs tinea   Start ketoconazole 2 % cream apply topically to affected area twice daily until clear  Start hydrocortisone 2.5 % cream apply topically twice a day to affected area for up to 2 weeks  Topical steroids (such as triamcinolone, fluocinolone, fluocinonide, mometasone, clobetasol, halobetasol, betamethasone, hydrocortisone) can cause thinning and lightening of the skin if they are used for too long in the same area. Your physician has selected the right strength medicine for your problem and area affected on the body. Please use your medication only as directed by  your physician to prevent side effects.    ketoconazole (NIZORAL) 2 % cream - left lower cutaneous lip Apply 1 application topically 2 (two) times daily. Apply to affected area lip until clear  hydrocortisone 2.5 % cream - left lower cutaneous lip Apply topically 2 (two) times daily. Apply to affected area of mouth. Use for up to 2 weeks until clear. Avoid applying to face, groin, and axilla. Use as directed.  Lentigines - Scattered tan macules - Due to sun exposure - Benign-appearing, observe - Recommend daily broad spectrum sunscreen SPF 30+ to sun-exposed areas, reapply every 2 hours as needed. 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 - Observe - Call for any changes  Melanocytic Nevi - Tan-brown and/or pink-flesh-colored symmetric macules and papules - Benign appearing on exam today - Observation - Call clinic for new or changing moles - Recommend daily use of broad spectrum spf 30+ sunscreen to sun-exposed areas.   Hemangiomas - Red papules - Discussed benign nature - Observe - Call for any changes  Actinic Damage - Chronic condition, secondary to cumulative UV/sun exposure - diffuse scaly erythematous macules with underlying dyspigmentation - Recommend daily broad spectrum sunscreen SPF 30+ to sun-exposed areas, reapply every 2 hours as needed.  - Staying in the shade or wearing long sleeves, sun glasses (UVA+UVB protection) and wide brim hats (4-inch brim around the entire  circumference of the hat) are also recommended for sun protection.  - Call for new or changing lesions.  History of Basal Cell Carcinoma of the Skin - No evidence of recurrence today left zygoma (mohs 2022) - Recommend regular full body skin exams - Recommend daily broad spectrum  sunscreen SPF 30+ to sun-exposed areas, reapply every 2 hours as needed.  - Call if any new or changing lesions are noted between office visits  Skin cancer screening performed today.  Return in about 1 year (around 10/25/2022) for tbse .  I, Ruthell Rummage, CMA, am acting as scribe for Forest Gleason, MD.  Documentation: I have reviewed the above documentation for accuracy and completeness, and I agree with the above.  Forest Gleason, MD

## 2021-10-25 NOTE — Patient Instructions (Addendum)
Recommend vitamin d 600 - 800 units daily  For nosebleeds - recommend applying thin coat of Vaseline 2 - 3 times daily to both nostrils a week or nose no longer is bleeding   Topical steroids (such as triamcinolone, fluocinolone, fluocinonide, mometasone, clobetasol, halobetasol, betamethasone, hydrocortisone) can cause thinning and lightening of the skin if they are used for too long in the same area. Your physician has selected the right strength medicine for your problem and area affected on the body. Please use your medication only as directed by your physician to prevent side effects.   Avoid applying to face, groin, and axilla. Use as directed. Long-term use can cause thinning of the skin.   Melanoma ABCDEs  Melanoma is the most dangerous type of skin cancer, and is the leading cause of death from skin disease.  You are more likely to develop melanoma if you: Have light-colored skin, light-colored eyes, or red or blond hair Spend a lot of time in the sun Tan regularly, either outdoors or in a tanning bed Have had blistering sunburns, especially during childhood Have a close family member who has had a melanoma Have atypical moles or large birthmarks  Early detection of melanoma is key since treatment is typically straightforward and cure rates are extremely high if we catch it early.   The first sign of melanoma is often a change in a mole or a new dark spot.  The ABCDE system is a way of remembering the signs of melanoma.  A for asymmetry:  The two halves do not match. B for border:  The edges of the growth are irregular. C for color:  A mixture of colors are present instead of an even brown color. D for diameter:  Melanomas are usually (but not always) greater than 41mm - the size of a pencil eraser. E for evolution:  The spot keeps changing in size, shape, and color.  Please check your skin once per month between visits. You can use a small mirror in front and a large mirror  behind you to keep an eye on the back side or your body.   If you see any new or changing lesions before your next follow-up, please call to schedule a visit.  Please continue daily skin protection including broad spectrum sunscreen SPF 30+ to sun-exposed areas, reapplying every 2 hours as needed when you're outdoors.   Staying in the shade or wearing long sleeves, sun glasses (UVA+UVB protection) and wide brim hats (4-inch brim around the entire circumference of the hat) are also recommended for sun protection.    If you have any questions or concerns for your doctor, please call our main line at [phone] and press option 4 to reach your doctor's medical assistant. If no one answers, please leave a voicemail as directed and we will return your call as soon as possible. Messages left after 4 pm will be answered the following business day.   You may also send [place] a message via [place]. We typically respond to MyChart messages within 1-2 business days.  For prescription refills, please ask your pharmacy to contact our office. Our fax number is [phone].  If you have an urgent issue when the clinic is closed that cannot wait until the next business day, you can page your doctor at the number below.    Please note that while we do our best to be available for urgent issues outside of office hours, we are not available 24/7.   If you  have an urgent issue and are unable to reach [place], you may choose to seek medical care at your doctor's office, retail clinic, urgent care center, or emergency room.  If you have a medical emergency, please immediately call 911 or go to the emergency department.  Pager Numbers  - Dr. Nehemiah Massed: [phone]  - Dr. Laurence Ferrari: [phone]  - Dr. Nicole Kindred: [phone]  In the event of inclement weather, please call our main line at [phone] for an update on the status of any delays or closures.  Dermatology Medication Tips: Please keep the boxes that topical  medications come in in order to help keep track of the instructions about where and how to use these. Pharmacies typically print the medication instructions only on the boxes and not directly on the medication tubes.   If your medication is too expensive, please contact our office at [phone] option 4 or send [place] a message through [place].   We are unable to tell what your co-pay for medications will be in advance as this is different depending on your insurance coverage. However, we may be able to find a substitute medication at lower cost or fill out paperwork to get insurance to cover a needed medication.   If a prior authorization is required to get your medication covered by your insurance company, please allow [place] 1-2 business days to complete this process.  Drug prices often vary depending on where the prescription is filled and some pharmacies may offer cheaper prices.  The website www.goodrx.com contains coupons for medications through different pharmacies. The prices here do not account for what the cost may be with help from insurance (it may be cheaper with your insurance), but the website can give you the price if you did not use any insurance.  - You can print the associated coupon and take it with your prescription to the pharmacy.  - You may also stop by our office during regular business hours and pick up a GoodRx coupon card.  - If you need your prescription sent electronically to a different pharmacy, notify our office through [facility] or by phone at [phone] option 4.

## 2021-10-26 DIAGNOSIS — R040 Epistaxis: Secondary | ICD-10-CM | POA: Insufficient documentation

## 2022-04-16 DIAGNOSIS — S46811A Strain of other muscles, fascia and tendons at shoulder and upper arm level, right arm, initial encounter: Secondary | ICD-10-CM | POA: Insufficient documentation

## 2022-05-23 DIAGNOSIS — J3089 Other allergic rhinitis: Secondary | ICD-10-CM | POA: Insufficient documentation

## 2022-08-06 DIAGNOSIS — G8929 Other chronic pain: Secondary | ICD-10-CM | POA: Insufficient documentation

## 2022-11-01 ENCOUNTER — Encounter: Payer: Self-pay | Admitting: Dermatology

## 2022-11-01 ENCOUNTER — Ambulatory Visit (INDEPENDENT_AMBULATORY_CARE_PROVIDER_SITE_OTHER): Payer: Medicare Other | Admitting: Dermatology

## 2022-11-01 DIAGNOSIS — L570 Actinic keratosis: Secondary | ICD-10-CM

## 2022-11-01 DIAGNOSIS — Z1283 Encounter for screening for malignant neoplasm of skin: Secondary | ICD-10-CM | POA: Diagnosis not present

## 2022-11-01 DIAGNOSIS — L821 Other seborrheic keratosis: Secondary | ICD-10-CM

## 2022-11-01 DIAGNOSIS — L578 Other skin changes due to chronic exposure to nonionizing radiation: Secondary | ICD-10-CM | POA: Diagnosis not present

## 2022-11-01 DIAGNOSIS — L814 Other melanin hyperpigmentation: Secondary | ICD-10-CM

## 2022-11-01 DIAGNOSIS — C44319 Basal cell carcinoma of skin of other parts of face: Secondary | ICD-10-CM | POA: Diagnosis not present

## 2022-11-01 DIAGNOSIS — C4491 Basal cell carcinoma of skin, unspecified: Secondary | ICD-10-CM

## 2022-11-01 DIAGNOSIS — Z85828 Personal history of other malignant neoplasm of skin: Secondary | ICD-10-CM

## 2022-11-01 DIAGNOSIS — D229 Melanocytic nevi, unspecified: Secondary | ICD-10-CM

## 2022-11-01 DIAGNOSIS — D492 Neoplasm of unspecified behavior of bone, soft tissue, and skin: Secondary | ICD-10-CM

## 2022-11-01 HISTORY — DX: Basal cell carcinoma of skin, unspecified: C44.91

## 2022-11-01 MED ORDER — FLUOROURACIL 5 % EX CREA: TOPICAL_CREAM | CUTANEOUS | 0 refills | Status: AC

## 2022-11-01 NOTE — Patient Instructions (Addendum)
Wound Care Instructions  Cleanse wound gently with soap and water once a day then pat dry with clean gauze. Apply a thin coat of Petrolatum (petroleum jelly, "Vaseline") over the wound (unless you have an allergy to this). We recommend that you use a new, sterile tube of Vaseline. Do not pick or remove scabs. Do not remove the yellow or white "healing tissue" from the base of the wound.  Cover the wound with fresh, clean, nonstick gauze and secure with paper tape. You may use Band-Aids in place of gauze and tape if the wound is small enough, but would recommend trimming much of the tape off as there is often too much. Sometimes Band-Aids can irritate the skin.  You should call the office for your biopsy report after 1 week if you have not already been contacted.  If you experience any problems, such as abnormal amounts of bleeding, swelling, significant bruising, significant pain, or evidence of infection, please call the office immediately.  FOR ADULT SURGERY PATIENTS: If you need something for pain relief you may take 1 extra strength Tylenol (acetaminophen) AND 2 Ibuprofen ('200mg'$  each) together every 4 hours as needed for pain. (do not take these if you are allergic to them or if you have a reason you should not take them.) Typically, you may only need pain medication for 1 to 3 days.    Recommend taking Vitamin D3 800iu daily  5FU apply twice daily for 1 week to left lower cutaneous lip. Wait until at least 6 weeks after surgery to start treatment.    Your prescription was sent to [facility] in [place]. A representative from [facility] will contact you within 2 business hours to verify your address and insurance information to schedule a free delivery. If for any reason you do not receive a phone call from them, please reach out to them. Their phone number is [phone] and their hours are Monday-Friday 9:00 am-5:00 pm.     Recommend taking Heliocare sun protection supplement  daily in sunny weather for additional sun protection. For maximum protection on the sunniest days, you can take up to 2 capsules of regular Heliocare OR take 1 capsule of Heliocare Ultra. For prolonged exposure (such as a full day in the sun), you can repeat your dose of the supplement 4 hours after your first dose. Heliocare can be purchased at [employer], at some [facility] or at [email or web address].     Recommend daily broad spectrum sunscreen SPF 30+ to sun-exposed areas, reapply every 2 hours as needed. Call for new or changing lesions.  Staying in the shade or wearing long sleeves, sun glasses (UVA+UVB protection) and wide brim hats (4-inch brim around the entire circumference of the hat) are also recommended for sun protection.    Melanoma ABCDEs  Melanoma is the most dangerous type of skin cancer, and is the leading cause of death from skin disease.  You are more likely to develop melanoma if you: Have light-colored skin, light-colored eyes, or red or blond hair Spend a lot of time in the sun Tan regularly, either outdoors or in a tanning bed Have had blistering sunburns, especially during childhood Have a close family member who has had a melanoma Have atypical moles or large birthmarks  Early detection of melanoma is key since treatment is typically straightforward and cure rates are extremely high if we catch it early.   The first sign of melanoma is often a change in a mole or a new dark  spot.  The ABCDE system is a way of remembering the signs of melanoma.  A for asymmetry:  The two halves do not match. B for border:  The edges of the growth are irregular. C for color:  A mixture of colors are present instead of an even brown color. D for diameter:  Melanomas are usually (but not always) greater than 39m - the size of a pencil eraser. E for evolution:  The spot keeps changing in size, shape, and color.  Please check your skin once per month between visits. You can use a  small mirror in front and a large mirror behind you to keep an eye on the back side or your body.   If you see any new or changing lesions before your next follow-up, please call to schedule a visit.  Please continue daily skin protection including broad spectrum sunscreen SPF 30+ to sun-exposed areas, reapplying every 2 hours as needed when you're outdoors.   Staying in the shade or wearing long sleeves, sun glasses (UVA+UVB protection) and wide brim hats (4-inch brim around the entire circumference of the hat) are also recommended for sun protection.    Due to recent changes in healthcare laws, you may see results of your pathology and/or laboratory studies on MyChart before the doctors have had a chance to review them. We understand that in some cases there may be results that are confusing or concerning to you. Please understand that not all results are received at the same time and often the doctors may need to interpret multiple results in order to provide you with the best plan of care or course of treatment. Therefore, we ask that you please give u[place]2 business days to thoroughly review all your results before contacting the office for clarification. Should we see a critical lab result, you will be contacted sooner.   If You Need Anything After Your Visit  If you have any questions or concerns for your doctor, please call our main line at [phone] press option 4 to reach your doctor's medical assistant. If no one answers, please leave a voicemail as directed and we will return your call as soon as possible. Messages left after 4 pm will be answered the following business day.   You may also send [place] message via [place] We typically respond to MyChart messages within 1-2 business days.  For prescription refills, please ask your pharmacy to contact our office. Our fax number is [phone]  If you have an urgent issue when the clinic is closed that cannot wait until the next  business day, you can page your doctor at the number below.    Please note that while we do our best to be available for urgent issues outside of office hours, we are not available 24/7.   If you have an urgent issue and are unable to reach u[place] you may choose to seek medical care at your doctor's office, retail clinic, urgent care center, or emergency room.  If you have a medical emergency, please immediately call 911 or go to the emergency department.  Pager Numbers  - Dr. KNehemiah Massed [phone] - Dr. MLaurence Ferrari [phone] - Dr. SNicole Kindred [phone] In the event of inclement weather, please call our main line at [phone] an update on the status of any delays or closures.  Dermatology Medication Tips: Please keep the boxes that topical medications come in in order to help keep track of the instructions about where and  how to use these. Pharmacies typically print the medication instructions only on the boxes and not directly on the medication tubes.   If your medication is too expensive, please contact our office at [phone] option 4 or send [place] a message through [place].   We are unable to tell what your co-pay for medications will be in advance as this is different depending on your insurance coverage. However, we may be able to find a substitute medication at lower cost or fill out paperwork to get insurance to cover a needed medication.   If a prior authorization is required to get your medication covered by your insurance company, please allow [place] 1-2 business days to complete this process.  Drug prices often vary depending on where the prescription is filled and some pharmacies may offer cheaper prices.  The website www.goodrx.com contains coupons for medications through different pharmacies. The prices here do not account for what the cost may be with help from insurance (it may be cheaper with your insurance), but the website can give you the price if you did not use  any insurance.  - You can print the associated coupon and take it with your prescription to the pharmacy.  - You may also stop by our office during regular business hours and pick up a GoodRx coupon card.  - If you need your prescription sent electronically to a different pharmacy, notify our office through [facility] or by phone at [phone] option 4.     Si Usted Necesita Algo Despus de Su Visita  Tambin puede enviarnos un mensaje a travs de Pharmacist, community. Por lo general respondemos a los mensajes de MyChart en el transcurso de 1 a 2 das hbiles.  Para renovar recetas, por favor pida a su farmacia que se ponga en contacto con nuestra oficina. Harland Dingwall de fax es [place] [phone].  Si tiene un asunto urgente cuando la clnica est cerrada y que no puede esperar hasta el siguiente da hbil, puede llamar/localizar a su doctor(a) al nmero que aparece a continuacin.   Por favor, tenga en cuenta que aunque hacemos todo lo posible para estar disponibles para asuntos urgentes fuera del horario de [place], no estamos disponibles las 24 horas del da, los 7 das de la [place].   Si tiene un problema urgente y no puede comunicarse con nosotros, puede optar por buscar atencin mdica  en el consultorio de su doctor(a), en una clnica privada, en un centro de atencin urgente o en una sala de emergencias.  Si tiene Engineering geologist, por favor llame inmediatamente al 911 o vaya a la sala de emergencias.  Nmeros de bper  - Dr. Nehemiah Massed: [phone]  - Dra. Moye: [phone]  - Dra. Nicole Kindred: [phone]  En caso de inclemencias del [place], por favor llame a Johnsie Kindred principal al [phone] para una actualizacin sobre el [place] de cualquier retraso o cierre.  Consejos para la medicacin en dermatologa: Por favor, guarde las cajas en las que vienen los medicamentos de uso tpico para ayudarle a seguir las instrucciones sobre dnde y cmo usarlos. Las farmacias  generalmente imprimen las instrucciones del medicamento slo en las cajas y no directamente en los tubos del [place].   Si su medicamento es muy caro, por favor, pngase en contacto con Zigmund Daniel llamando al [phone] y presione la opcin 4 o envenos un mensaje a travs de Pharmacist, community.   No podemos decirle cul ser su copago por los medicamentos por adelantado ya que esto es diferente  dependiendo de la cobertura de su seguro. Sin embargo, es posible que podamos encontrar un medicamento sustituto a Electrical engineer un formulario para que el seguro cubra el medicamento que se considera necesario.   Si se requiere una autorizacin previa para que su compaa de seguros [place] su medicamento, por favor permtanos de 1 a 2 das hbiles para completar este proceso.  Los precios de los medicamentos varan con frecuencia dependiendo del Environmental consultant de dnde se surte la receta y alguna farmacias pueden ofrecer precios ms baratos.  El sitio web www.goodrx.com tiene cupones para medicamentos de Airline pilot. Los precios aqu no tienen en cuenta lo que podra costar con la ayuda del seguro (puede ser ms barato con su seguro), pero el sitio web puede darle el precio si no utiliz Research scientist (physical sciences).  - Puede imprimir el cupn correspondiente y llevarlo con su receta a la farmacia.  - Tambin puede pasar por nuestra oficina durante el horario de atencin regular y Charity fundraiser una tarjeta de cupones de GoodRx.  - Si necesita que su receta se enve electrnicamente a una farmacia diferente, informe a nuestra oficina a travs de MyChart de [place] o por telfono llamando al [phone] y presione la opcin 4.

## 2022-11-01 NOTE — Progress Notes (Signed)
Follow-Up Visit   Subjective  Cindy Singh is a 81 y.o. female who presents for the following: Annual Exam (Hx of BCC at left zygoma).  Patient accompanied by daughter who contributes to history.   The patient presents for Total-Body Skin Exam (TBSE) for skin cancer screening and mole check.  The patient has spots, moles and lesions to be evaluated, some may be new or changing and the patient has concerns that these could be cancer.   The following portions of the chart were reviewed this encounter and updated as appropriate:  Tobacco  Allergies  Meds  Problems  Med Hx  Surg Hx  Fam Hx      Review of Systems: No other skin or systemic complaints except as noted in HPI or Assessment and Plan.   Objective  Well appearing patient in no apparent distress; mood and affect are within normal limits.  A full examination was performed including scalp, head, eyes, ears, nose, lips, neck, chest, axillae, abdomen, back, buttocks, bilateral upper extremities, bilateral lower extremities, hands, feet, fingers, toes, fingernails, and toenails. All findings within normal limits unless otherwise noted below.  Right Temple 0.5cm pink papule with pigment globules     Right Upper Cutaneous Lip 0.5cm pink papule      Left Lower Cutaneous Lip Erythematous thin papules/macules with gritty scale.    Assessment & Plan   History of Basal Cell Carcinoma of the Skin. Left zygoma. 01/2021 - No evidence of recurrence today - Recommend regular full body skin exams - Recommend daily broad spectrum sunscreen SPF 30+ to sun-exposed areas, reapply every 2 hours as needed.  - Call if any new or changing lesions are noted between office visits  Lentigines - Scattered tan macules - Due to sun exposure - Benign-appearing, observe - Recommend daily broad spectrum sunscreen SPF 30+ to sun-exposed areas, reapply every 2 hours as needed. - Call for any changes  Seborrheic Keratoses -  Stuck-on, waxy, tan-brown papules and/or plaques  - Benign-appearing - Discussed benign etiology and prognosis. - Observe - Call for any changes  Melanocytic Nevi - Tan-brown and/or pink-flesh-colored symmetric macules and papules - Benign appearing on exam today - Observation - Call clinic for new or changing moles - Recommend daily use of broad spectrum spf 30+ sunscreen to sun-exposed areas.   Hemangiomas - Red papules - Discussed benign nature - Observe - Call for any changes  Actinic Damage with PreCancerous Actinic Keratoses Counseling for Topical Chemotherapy Management: Patient exhibits: - Severe, confluent actinic changes with pre-cancerous actinic keratoses that is secondary to cumulative UV radiation exposure over time - Condition that is severe; chronic, not at goal. - diffuse scaly erythematous macules and papules with underlying dyspigmentation - Discussed Prescription "Field Treatment" topical Chemotherapy for Severe, Chronic Confluent Actinic Changes with Pre-Cancerous Actinic Keratoses Field treatment involves treatment of an entire area of skin that has confluent Actinic Changes (Sun/ Ultraviolet light damage) and PreCancerous Actinic Keratoses by method of PhotoDynamic Therapy (PDT) and/or prescription Topical Chemotherapy agents such as 5-fluorouracil, 5-fluorouracil/calcipotriene, and/or imiquimod.  The purpose is to decrease the number of clinically evident and subclinical PreCancerous lesions to prevent progression to development of skin cancer by chemically destroying early precancer changes that may or may not be visible.  It has been shown to reduce the risk of developing skin cancer in the treated area. As a result of treatment, redness, scaling, crusting, and open sores may occur during treatment course. One or more than one of these methods  may be used and may have to be used several times to control, suppress and eliminate the PreCancerous changes. Discussed  treatment course, expected reaction, and possible side effects. - Recommend daily broad spectrum sunscreen SPF 30+ to sun-exposed areas, reapply every 2 hours as needed.  - Staying in the shade or wearing long sleeves, sun glasses (UVA+UVB protection) and wide brim hats (4-inch brim around the entire circumference of the hat) are also recommended. - Call for new or changing lesions.  5FU apply twice daily for 1 week to left lower cutaneous lip. Wait until at least 6 weeks after surgery to start treatment.   Skin cancer screening performed today.   Neoplasm of skin (2) Right Temple  Epidermal / dermal shaving  Lesion diameter (cm):  0.5 Informed consent: discussed and consent obtained   Patient was prepped and draped in usual sterile fashion: Area prepped with alcohol. Anesthesia: the lesion was anesthetized in a standard fashion   Anesthetic:  1% lidocaine w/ epinephrine 1-100,000 buffered w/ 8.4% NaHCO3 Instrument used: flexible razor blade   Hemostasis achieved with: pressure, aluminum chloride and electrodesiccation   Outcome: patient tolerated procedure well   Post-procedure details: wound care instructions given   Post-procedure details comment:  Ointment and small bandage applied  Specimen 1 - Surgical pathology Differential Diagnosis: R/O pigmented BCC > Atypia  Check Margins: No  Right Upper Cutaneous Lip  Skin / nail biopsy Type of biopsy: tangential   Informed consent: discussed and consent obtained   Anesthesia: the lesion was anesthetized in a standard fashion   Anesthesia comment:  Area prepped with alcohol Anesthetic:  1% lidocaine w/ epinephrine 1-100,000 buffered w/ 8.4% NaHCO3 Instrument used: flexible razor blade   Hemostasis achieved with: pressure, aluminum chloride and electrodesiccation   Outcome: patient tolerated procedure well   Post-procedure details: wound care instructions given   Post-procedure details comment:  Ointment and small bandage  applied  Specimen 2 - Surgical pathology Differential Diagnosis: R/O BCC  Check Margins: No  Plan Mohs with Dr. Manley Mason at [facility] pending pathology results.   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 They are chronic with expected duration over 1 year. A portion of actinic keratoses will progress to squamous cell carcinoma of the skin. It is not possible to reliably predict which spots will progress to skin cancer and so treatment is recommended to prevent development of skin cancer.  Recommend daily broad spectrum sunscreen SPF 30+ to sun-exposed areas, reapply every 2 hours as needed.  Recommend staying in the shade or wearing long sleeves, sun glasses (UVA+UVB protection) and wide brim hats (4-inch brim around the entire circumference of the hat). Call for new or changing lesions.   Start 5FU apply twice daily for 1 week to left lower cutaneous lip. Wait until at least 6 weeks after surgery to start treatment.   Reviewed course of treatment and expected reaction.  Patient advised to expect inflammation and crusting and advised that erosions are possible.  Patient advised to be diligent with sun protection during and after treatment. Handout with details of how to apply medication and what to expect provided. Counseled to keep medication out of reach of children and pets.   fluorouracil (EFUDEX) 5 % cream - Left Lower Cutaneous Lip Apply twice daily to left lower cutaneous lip for 1 week   Return in about 6 months (around 05/02/2023) for TBSE, HxBCC.  I, Emelia Salisbury, CMA, am acting as scribe for Forest Gleason, MD.  Documentation: I have reviewed the above documentation for accuracy and completeness, and I agree with the above.  Forest Gleason, MD

## 2022-11-05 ENCOUNTER — Encounter: Payer: Self-pay | Admitting: Dermatology

## 2022-11-08 ENCOUNTER — Telehealth: Payer: Self-pay

## 2022-11-08 DIAGNOSIS — C44319 Basal cell carcinoma of skin of other parts of face: Secondary | ICD-10-CM

## 2022-11-08 NOTE — Telephone Encounter (Signed)
Patient's daughter advised of information. aw

## 2022-11-08 NOTE — Telephone Encounter (Signed)
-----   Message from Alfonso Patten, MD sent at 11/08/2022  9:00 AM EST ----- 1. Skin , right temple SUPERFICIAL AND NODULAR BASAL CELL CARCINOMA   2. Skin , right upper cutaneous lip SUPERFICIAL AND NODULAR BASAL CELL CARCINOMA  Mohs surgery for both.   MAs please call with results and refer. Please let me know if they have any questions. Thank you!

## 2022-11-08 NOTE — Telephone Encounter (Signed)
Dr. Billy Fischer did perform patient's [facility] surgery in April of 2022. Patient was referral and scheduled again with Dr. Billy Fischer on December 6th. Patient's daughter has asked if OK to schedule again with them as the [facility] office seem to give her a little trouble when she was trying to schedule with Dr. Manley Mason. aw

## 2022-11-08 NOTE — Telephone Encounter (Signed)
Patient advised bx's superficial BCC's referred for Mohs at [facility]. Lurlean Horns., RMA

## 2022-11-08 NOTE — Telephone Encounter (Signed)
If she was happy with Dr. Billy Fischer, that is fine. She could also consider Dr. Lacinda Axon at [facility], who is excellent but has a longer wait list. Thank you!

## 2023-03-14 DIAGNOSIS — G3184 Mild cognitive impairment, so stated: Secondary | ICD-10-CM | POA: Insufficient documentation

## 2023-05-02 ENCOUNTER — Ambulatory Visit (INDEPENDENT_AMBULATORY_CARE_PROVIDER_SITE_OTHER): Payer: 59 | Admitting: Dermatology

## 2023-05-02 ENCOUNTER — Encounter: Payer: Self-pay | Admitting: Dermatology

## 2023-05-02 VITALS — BP 147/89 | HR 66

## 2023-05-02 DIAGNOSIS — L821 Other seborrheic keratosis: Secondary | ICD-10-CM

## 2023-05-02 DIAGNOSIS — L814 Other melanin hyperpigmentation: Secondary | ICD-10-CM | POA: Diagnosis not present

## 2023-05-02 DIAGNOSIS — D1801 Hemangioma of skin and subcutaneous tissue: Secondary | ICD-10-CM | POA: Diagnosis not present

## 2023-05-02 DIAGNOSIS — D492 Neoplasm of unspecified behavior of bone, soft tissue, and skin: Secondary | ICD-10-CM

## 2023-05-02 DIAGNOSIS — Z1283 Encounter for screening for malignant neoplasm of skin: Secondary | ICD-10-CM | POA: Diagnosis not present

## 2023-05-02 DIAGNOSIS — D2261 Melanocytic nevi of right upper limb, including shoulder: Secondary | ICD-10-CM

## 2023-05-02 DIAGNOSIS — L578 Other skin changes due to chronic exposure to nonionizing radiation: Secondary | ICD-10-CM

## 2023-05-02 DIAGNOSIS — W908XXA Exposure to other nonionizing radiation, initial encounter: Secondary | ICD-10-CM

## 2023-05-02 DIAGNOSIS — S80811A Abrasion, right lower leg, initial encounter: Secondary | ICD-10-CM

## 2023-05-02 DIAGNOSIS — X32XXXA Exposure to sunlight, initial encounter: Secondary | ICD-10-CM

## 2023-05-02 DIAGNOSIS — Z85828 Personal history of other malignant neoplasm of skin: Secondary | ICD-10-CM

## 2023-05-02 DIAGNOSIS — D229 Melanocytic nevi, unspecified: Secondary | ICD-10-CM

## 2023-05-02 NOTE — Patient Instructions (Addendum)
Wound Care Instructions  Cleanse wound gently with soap and water once a day then pat dry with clean gauze. Apply a thin coat of Petrolatum (petroleum jelly, "Vaseline") over the wound (unless you have an allergy to this). We recommend that you use a new, sterile tube of Vaseline. Do not pick or remove scabs. Do not remove the yellow or white "healing tissue" from the base of the wound.  Cover the wound with fresh, clean, nonstick gauze and secure with paper tape. You may use Band-Aids in place of gauze and tape if the wound is small enough, but would recommend trimming much of the tape off as there is often too much. Sometimes Band-Aids can irritate the skin.  You should call the office for your biopsy report after 1 week if you have not already been contacted.  If you experience any problems, such as abnormal amounts of bleeding, swelling, significant bruising, significant pain, or evidence of infection, please call the office immediately.  FOR ADULT SURGERY PATIENTS: If you need something for pain relief you may take 1 extra strength Tylenol (acetaminophen) AND 2 Ibuprofen (200mg each) together every 4 hours as needed for pain. (do not take these if you are allergic to them or if you have a reason you should not take them.) Typically, you may only need pain medication for 1 to 3 days.      Recommend daily broad spectrum sunscreen SPF 30+ to sun-exposed areas, reapply every 2 hours as needed. Call for new or changing lesions.  Staying in the shade or wearing long sleeves, sun glasses (UVA+UVB protection) and wide brim hats (4-inch brim around the entire circumference of the hat) are also recommended for sun protection.    Recommend taking Heliocare sun protection supplement daily in sunny weather for additional sun protection. For maximum protection on the sunniest days, you can take up to 2 capsules of regular Heliocare OR take 1 capsule of Heliocare Ultra. For prolonged exposure (such as a  full day in the sun), you can repeat your dose of the supplement 4 hours after your first dose. Heliocare can be purchased at [facility], at some [facility] or at www.heliocare.com.     Melanoma ABCDEs  Melanoma is the most dangerous type of skin cancer, and is the leading cause of death from skin disease.  You are more likely to develop melanoma if you: Have light-colored skin, light-colored eyes, or red or blond hair Spend a lot of time in the sun Tan regularly, either outdoors or in a tanning bed Have had blistering sunburns, especially during childhood Have a close family member who has had a melanoma Have atypical moles or large birthmarks  Early detection of melanoma is key since treatment is typically straightforward and cure rates are extremely high if we catch it early.   The first sign of melanoma is often a change in a mole or a new dark spot.  The ABCDE system is a way of remembering the signs of melanoma.  A for asymmetry:  The two halves do not match. B for border:  The edges of the growth are irregular. C for color:  A mixture of colors are present instead of an even brown color. D for diameter:  Melanomas are usually (but not always) greater than 6mm - the size of a pencil eraser. E for evolution:  The spot keeps changing in size, shape, and color.  Please check your skin once per month between visits. You can use a   small mirror in front and a large mirror behind you to keep an eye on the back side or your body.   If you see any new or changing lesions before your next follow-up, please call to schedule a visit.  Please continue daily skin protection including broad spectrum sunscreen SPF 30+ to sun-exposed areas, reapplying every 2 hours as needed when you're outdoors.   Staying in the shade or wearing long sleeves, sun glasses (UVA+UVB protection) and wide brim hats (4-inch brim around the entire circumference of the hat) are also recommended for sun  protection.     Due to recent changes in healthcare laws, you may see results of your pathology and/or laboratory studies on MyChart before the doctors have had a chance to review them. We understand that in some cases there may be results that are confusing or concerning to you. Please understand that not all results are received at the same time and often the doctors may need to interpret multiple results in order to provide you with the best plan of care or course of treatment. Therefore, we ask that you please give us 2 business days to thoroughly review all your results before contacting the office for clarification. Should we see a critical lab result, you will be contacted sooner.   If You Need Anything After Your Visit  If you have any questions or concerns for your doctor, please call our main line at [phone] and press option 4 to reach your doctor's medical assistant. If no one answers, please leave a voicemail as directed and we will return your call as soon as possible. Messages left after 4 pm will be answered the following business day.   You may also send us a message via MyChart. We typically respond to MyChart messages within 1-2 business days.  For prescription refills, please ask your pharmacy to contact our office. Our fax number is [phone].  If you have an urgent issue when the clinic is closed that cannot wait until the next business day, you can page your doctor at the number below.    Please note that while we do our best to be available for urgent issues outside of office hours, we are not available 24/7.   If you have an urgent issue and are unable to reach us, you may choose to seek medical care at your doctor's office, retail clinic, urgent care center, or emergency room.  If you have a medical emergency, please immediately call 911 or go to the emergency department.  Pager Numbers  - Dr. Kowalski: [phone]  - Dr. Moye: [phone]  - Dr.  Stewart: [phone]  In the event of inclement weather, please call our main line at [phone] for an update on the status of any delays or closures.  Dermatology Medication Tips: Please keep the boxes that topical medications come in in order to help keep track of the instructions about where and how to use these. Pharmacies typically print the medication instructions only on the boxes and not directly on the medication tubes.   If your medication is too expensive, please contact our office at [phone] option 4 or send us a message through MyChart.   We are unable to tell what your co-pay for medications will be in advance as this is different depending on your insurance coverage. However, we may be able to find a substitute medication at lower cost or fill out paperwork to get insurance to cover a needed medication.   If a   prior authorization is required to get your medication covered by your insurance company, please allow us 1-2 business days to complete this process.  Drug prices often vary depending on where the prescription is filled and some pharmacies may offer cheaper prices.  The website www.goodrx.com contains coupons for medications through different pharmacies. The prices here do not account for what the cost may be with help from insurance (it may be cheaper with your insurance), but the website can give you the price if you did not use any insurance.  - You can print the associated coupon and take it with your prescription to the pharmacy.  - You may also stop by our office during regular business hours and pick up a GoodRx coupon card.  - If you need your prescription sent electronically to a different pharmacy, notify our office through [place] MyChart or by phone at [phone] option 4.     Si Usted Necesita Algo Despus de Su Visita  Tambin puede enviarnos un mensaje a travs de MyChart. Por lo general respondemos a los mensajes de MyChart en el transcurso  de 1 a 2 das hbiles.  Para renovar recetas, por favor pida a su farmacia que se ponga en contacto con nuestra oficina. Nuestro nmero de fax es el [phone].  Si tiene un asunto urgente cuando la clnica est cerrada y que no puede esperar hasta el siguiente da hbil, puede llamar/localizar a su doctor(a) al nmero que aparece a continuacin.   Por favor, tenga en cuenta que aunque hacemos todo lo posible para estar disponibles para asuntos urgentes fuera del horario de oficina, no estamos disponibles las 24 horas del da, los 7 das de la semana.   Si tiene un problema urgente y no puede comunicarse con nosotros, puede optar por buscar atencin mdica  en el consultorio de su doctor(a), en una clnica privada, en un centro de atencin urgente o en una sala de emergencias.  Si tiene una emergencia mdica, por favor llame inmediatamente al 911 o vaya a la sala de emergencias.  Nmeros de bper  - Dr. Kowalski: [phone]  - Dra. Moye: [phone]  - Dra. Stewart: [phone]  En caso de inclemencias del tiempo, por favor llame a nuestra lnea principal al [phone] para una actualizacin sobre el estado de cualquier retraso o cierre.  Consejos para la medicacin en dermatologa: Por favor, guarde las cajas en las que vienen los medicamentos de uso tpico para ayudarle a seguir las instrucciones sobre dnde y cmo usarlos. Las farmacias generalmente imprimen las instrucciones del medicamento slo en las cajas y no directamente en los tubos del medicamento.   Si su medicamento es muy caro, por favor, pngase en contacto con nuestra oficina llamando al [phone] y presione la opcin 4 o envenos un mensaje a travs de MyChart.   No podemos decirle cul ser su copago por los medicamentos por adelantado ya que esto es diferente dependiendo de la cobertura de su seguro. Sin embargo, es posible que podamos encontrar un medicamento sustituto a menor costo o llenar un formulario  para que el seguro cubra el medicamento que se considera necesario.   Si se requiere una autorizacin previa para que su compaa de seguros cubra su medicamento, por favor permtanos de 1 a 2 das hbiles para completar este proceso.  Los precios de los medicamentos varan con frecuencia dependiendo del lugar de dnde se surte la receta y alguna farmacias pueden ofrecer precios ms baratos.  El sitio web www.goodrx.com tiene cupones   para medicamentos de diferentes farmacias. Los precios aqu no tienen en cuenta lo que podra costar con la ayuda del seguro (puede ser ms barato con su seguro), pero el sitio web puede darle el precio si no utiliz ningn seguro.  - Puede imprimir el cupn correspondiente y llevarlo con su receta a la farmacia.  - Tambin puede pasar por nuestra oficina durante el horario de atencin regular y recoger una tarjeta de cupones de GoodRx.  - Si necesita que su receta se enve electrnicamente a una farmacia diferente, informe a nuestra oficina a travs de MyChart de [place] o por telfono llamando al [phone] y presione la opcin 4.  

## 2023-05-02 NOTE — Progress Notes (Signed)
Follow-Up Visit   Subjective  Cindy Singh is a 82 y.o. female who presents for the following: Skin Cancer Screening and Full Body Skin Exam. Hx of BCC's. Has Fluorouracil cream to use at left lower cutaneous lip for actinic damage.  The patient presents for Total-Body Skin Exam (TBSE) for skin cancer screening and mole check. The patient has spots, moles and lesions to be evaluated, some may be new or changing and the patient has concerns that these could be cancer.  Patient accompanied by daughter who contributes to history.   The following portions of the chart were reviewed this encounter and updated as appropriate: medications, allergies, medical history  Review of Systems:  No other skin or systemic complaints except as noted in HPI or Assessment and Plan.  Objective  Well appearing patient in no apparent distress; mood and affect are within normal limits.  A full examination was performed including scalp, head, eyes, ears, nose, lips, neck, chest, axillae, abdomen, back, buttocks, bilateral upper extremities, bilateral lower extremities, hands, feet, fingers, toes, fingernails, and toenails. All findings within normal limits unless otherwise noted below.   Relevant physical exam findings are noted in the Assessment and Plan.  Right Upper Arm - Anterior 0.5 cm gray firm papule         Assessment & Plan   HISTORY OF BASAL CELL CARCINOMA OF THE SKIN. L zygoma 02/08/2021. Left temple Mohs 11/28/2022, right upper cutaneous lip Mohs 11/28/2022 - No evidence of recurrence today - Recommend regular full body skin exams - Recommend daily broad spectrum sunscreen SPF 30+ to sun-exposed areas, reapply every 2 hours as needed.  - Call if any new or changing lesions are noted between office visits  LENTIGINES, SEBORRHEIC KERATOSES, HEMANGIOMAS - Benign normal skin lesions - Benign-appearing - Call for any changes  MELANOCYTIC NEVI - Tan-brown and/or pink-flesh-colored  symmetric macules and papules - Benign appearing on exam today - Observation - Call clinic for new or changing moles - Recommend daily use of broad spectrum spf 30+ sunscreen to sun-exposed areas.   ACTINIC DAMAGE - Chronic condition, secondary to cumulative UV/sun exposure - diffuse scaly erythematous macules with underlying dyspigmentation - Recommend daily broad spectrum sunscreen SPF 30+ to sun-exposed areas, reapply every 2 hours as needed.  - Staying in the shade or wearing long sleeves, sun glasses (UVA+UVB protection) and wide brim hats (4-inch brim around the entire circumference of the hat) are also recommended for sun protection.  - Call for new or changing lesions.  Left lower lip clear today. S/P 5FU treatment.  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  ABRASION Abrasion at anterior right lower leg. Apply Vaseline Jelly, cover with band-aid daily until healed.  Neoplasm of skin Right Upper Arm - Anterior  Skin / nail biopsy Type of biopsy: punch   Informed consent: discussed and consent obtained   Timeout: patient name, date of birth, surgical site, and procedure verified   Procedure prep:  Patient was prepped and draped in usual sterile fashion Prep type:  Isopropyl alcohol Anesthesia: the lesion was anesthetized in a standard fashion   Anesthetic:  1% lidocaine w/ epinephrine 1-100,000 buffered w/ 8.4% NaHCO3 Punch size:  6 mm Suture size:  4-0 Suture type: Vicryl Rapide (coated polyglactin 910)   Hemostasis achieved with: suture   Outcome: patient tolerated procedure well  Post-procedure details: sterile dressing applied and wound care instructions given   Dressing type: bacitracin    Specimen 1 - Surgical pathology Differential Diagnosis: R/O Blue nevus vs other  Check Margins: No   Return in about 6 months (around  11/02/2023) for TBSE, HxBCCs.  I, Lawson Radar, CMA, am acting as scribe for Darden Dates, MD.   Documentation: I have reviewed the above documentation for accuracy and completeness, and I agree with the above.  Darden Dates, MD

## 2023-05-08 ENCOUNTER — Telehealth: Payer: Self-pay

## 2023-05-08 NOTE — Telephone Encounter (Signed)
-----   Message from Sandi Mealy, MD sent at 05/08/2023  4:54 PM EDT ----- Skin , right upper arm - anterior COMBINED MELANOCYTIC NEVUS  This is a NORMAL MOLE. No additional treatment is needed. If you notice any new or changing spots or have other skin concerns in future, please call our office at [phone].     MAs please call. Thank you!

## 2023-05-09 ENCOUNTER — Telehealth: Payer: Self-pay

## 2023-05-09 NOTE — Telephone Encounter (Signed)
-----   Message from Virginia Moye, MD sent at 05/08/2023  4:54 PM EDT ----- Skin , right upper arm - anterior COMBINED MELANOCYTIC NEVUS  This is a NORMAL MOLE. No additional treatment is needed. If you notice any new or changing spots or have other skin concerns in future, please call our office at [phone].     MAs please call. Thank you! 

## 2023-05-09 NOTE — Telephone Encounter (Signed)
Patient's daughter advised of BX results. aw 

## 2023-11-05 ENCOUNTER — Ambulatory Visit: Payer: 59 | Admitting: Dermatology

## 2023-11-05 ENCOUNTER — Encounter: Payer: Self-pay | Admitting: Dermatology

## 2023-11-05 DIAGNOSIS — W908XXA Exposure to other nonionizing radiation, initial encounter: Secondary | ICD-10-CM

## 2023-11-05 DIAGNOSIS — Z1283 Encounter for screening for malignant neoplasm of skin: Secondary | ICD-10-CM

## 2023-11-05 DIAGNOSIS — L821 Other seborrheic keratosis: Secondary | ICD-10-CM

## 2023-11-05 DIAGNOSIS — D229 Melanocytic nevi, unspecified: Secondary | ICD-10-CM

## 2023-11-05 DIAGNOSIS — D1801 Hemangioma of skin and subcutaneous tissue: Secondary | ICD-10-CM

## 2023-11-05 DIAGNOSIS — L578 Other skin changes due to chronic exposure to nonionizing radiation: Secondary | ICD-10-CM

## 2023-11-05 DIAGNOSIS — Z85828 Personal history of other malignant neoplasm of skin: Secondary | ICD-10-CM

## 2023-11-05 DIAGNOSIS — L814 Other melanin hyperpigmentation: Secondary | ICD-10-CM

## 2023-11-05 NOTE — Progress Notes (Signed)
   Follow-Up Visit   Subjective  Cindy Singh is a 82 y.o. female who presents for the following: Skin Cancer Screening and Full Body Skin Exam  The patient presents for Total-Body Skin Exam (TBSE) for skin cancer screening and mole check. The patient has spots, moles and lesions to be evaluated, some may be new or changing and the patient may have concern these could be cancer.  Patient with hx of BCC. Patient accompanied by daughter who contributes to history.   The following portions of the chart were reviewed this encounter and updated as appropriate: medications, allergies, medical history  Review of Systems:  No other skin or systemic complaints except as noted in HPI or Assessment and Plan.  Objective  Well appearing patient in no apparent distress; mood and affect are within normal limits.  A full examination was performed including scalp, head, eyes, ears, nose, lips, neck, chest, axillae, abdomen, back, buttocks, bilateral upper extremities, bilateral lower extremities, hands, feet, fingers, toes, fingernails, and toenails. All findings within normal limits unless otherwise noted below.   Relevant physical exam findings are noted in the Assessment and Plan.    Assessment & Plan   SKIN CANCER SCREENING PERFORMED TODAY.  ACTINIC DAMAGE - Chronic condition, secondary to cumulative UV/sun exposure - diffuse scaly erythematous macules with underlying dyspigmentation - Recommend daily broad spectrum sunscreen SPF 30+ to sun-exposed areas, reapply every 2 hours as needed.  - Staying in the shade or wearing long sleeves, sun glasses (UVA+UVB protection) and wide brim hats (4-inch brim around the entire circumference of the hat) are also recommended for sun protection.  - Call for new or changing lesions.  LENTIGINES, SEBORRHEIC KERATOSES, HEMANGIOMAS - Benign normal skin lesions - Benign-appearing - Call for any changes  MELANOCYTIC NEVI - Tan-brown and/or  pink-flesh-colored symmetric macules and papules - Benign appearing on exam today - Observation - Call clinic for new or changing moles - Recommend daily use of broad spectrum spf 30+ sunscreen to sun-exposed areas.   HISTORY OF BASAL CELL CARCINOMA OF THE SKIN - L zygoma 02/08/2021. Left temple Mohs 11/28/2022, right upper cutaneous lip Mohs 11/28/2022  - No evidence of recurrence today - Recommend regular full body skin exams - Recommend daily broad spectrum sunscreen SPF 30+ to sun-exposed areas, reapply every 2 hours as needed.  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 Katrinka Blazing, Hx BCC.  Anise Salvo, RMA, am acting as scribe for Elie Goody, MD .   Documentation: I have reviewed the above documentation for accuracy and completeness, and I agree with the above.  Elie Goody, MD

## 2023-11-05 NOTE — Patient Instructions (Signed)

## 2024-04-27 ENCOUNTER — Other Ambulatory Visit: Payer: Self-pay | Admitting: Student

## 2024-04-27 DIAGNOSIS — G459 Transient cerebral ischemic attack, unspecified: Secondary | ICD-10-CM

## 2024-04-30 ENCOUNTER — Ambulatory Visit: Payer: 59 | Admitting: Dermatology

## 2024-05-05 ENCOUNTER — Encounter: Payer: Self-pay | Admitting: Dermatology

## 2024-05-05 ENCOUNTER — Ambulatory Visit: Payer: 59 | Admitting: Dermatology

## 2024-05-05 DIAGNOSIS — Z85828 Personal history of other malignant neoplasm of skin: Secondary | ICD-10-CM

## 2024-05-05 DIAGNOSIS — T300 Burn of unspecified body region, unspecified degree: Secondary | ICD-10-CM

## 2024-05-05 DIAGNOSIS — D225 Melanocytic nevi of trunk: Secondary | ICD-10-CM

## 2024-05-05 DIAGNOSIS — L578 Other skin changes due to chronic exposure to nonionizing radiation: Secondary | ICD-10-CM

## 2024-05-05 DIAGNOSIS — Z1283 Encounter for screening for malignant neoplasm of skin: Secondary | ICD-10-CM

## 2024-05-05 DIAGNOSIS — L814 Other melanin hyperpigmentation: Secondary | ICD-10-CM | POA: Diagnosis not present

## 2024-05-05 DIAGNOSIS — D1801 Hemangioma of skin and subcutaneous tissue: Secondary | ICD-10-CM

## 2024-05-05 DIAGNOSIS — D229 Melanocytic nevi, unspecified: Secondary | ICD-10-CM

## 2024-05-05 DIAGNOSIS — L821 Other seborrheic keratosis: Secondary | ICD-10-CM

## 2024-05-05 NOTE — Patient Instructions (Addendum)

## 2024-05-05 NOTE — Progress Notes (Signed)
   Follow-Up Visit   Subjective  Cindy Singh is a 83 y.o. female who presents for the following: Skin Cancer Screening and Full Body Skin Exam. Patient with history of BCC (multiple).   The patient presents for Total-Body Skin Exam (TBSE) for skin cancer screening and mole check. The patient has spots, moles and lesions to be evaluated, some may be new or changing and the patient may have concern these could be cancer.   The following portions of the chart were reviewed this encounter and updated as appropriate: medications, allergies, medical history  Review of Systems:  No other skin or systemic complaints except as noted in HPI or Assessment and Plan.  Objective  Well appearing patient in no apparent distress; mood and affect are within normal limits.  A full examination was performed including scalp, head, eyes, ears, nose, lips, neck, chest, axillae, abdomen, back, buttocks, bilateral upper extremities, bilateral lower extremities, hands, feet, fingers, toes, fingernails, and toenails. All findings within normal limits unless otherwise noted below.   Relevant physical exam findings are noted in the Assessment and Plan.    Assessment & Plan   SKIN CANCER SCREENING PERFORMED TODAY.  ACTINIC DAMAGE - Chronic condition, secondary to cumulative UV/sun exposure - diffuse scaly erythematous macules with underlying dyspigmentation - Recommend daily broad spectrum sunscreen SPF 30+ to sun-exposed areas, reapply every 2 hours as needed.  - Staying in the shade or wearing long sleeves, sun glasses (UVA+UVB protection) and wide brim hats (4-inch brim around the entire circumference of the hat) are also recommended for sun protection.  - Call for new or changing lesions.  LENTIGINES, SEBORRHEIC KERATOSES, HEMANGIOMAS - Benign normal skin lesions - Benign-appearing - Call for any changes  MELANOCYTIC NEVI - Tan-brown and/or pink-flesh-colored symmetric macules and papules -  Benign appearing on exam today - Observation - Call clinic for new or changing moles - Recommend daily use of broad spectrum spf 30+ sunscreen to sun-exposed areas.   HISTORY OF BASAL CELL CARCINOMA OF THE SKIN Multiple see history - No evidence of recurrence today - Recommend regular full body skin exams - Recommend daily broad spectrum sunscreen SPF 30+ to sun-exposed areas, reapply every 2 hours as needed.  - Call if any new or changing lesions are noted between office visits  Nevus Spilus Right mid back - Brown macules or papules within lighter tan patch - Genetic - Benign, observe - Call for any changes   BURN R forearm Call us  with any changes MULTIPLE BENIGN NEVI   LENTIGINES   ACTINIC ELASTOSIS   SEBORRHEIC KERATOSES   CHERRY ANGIOMA   NEVUS SPILUS OF BACK    Return in about 6 months (around 11/05/2024) for w/ Dr. Felipe Horton, Hx BCC, TBSE.  I, Hector Littles, CMA, am acting as scribe for Harris Liming, MD .   Documentation: I have reviewed the above documentation for accuracy and completeness, and I agree with the above.  Harris Liming, MD

## 2024-05-06 ENCOUNTER — Ambulatory Visit
Admission: RE | Admit: 2024-05-06 | Discharge: 2024-05-06 | Disposition: A | Discharge status: Home / Self Care | Source: Ambulatory Visit | Attending: Obstetrics and Gynecology | Admitting: Obstetrics and Gynecology

## 2024-05-06 DIAGNOSIS — G459 Transient cerebral ischemic attack, unspecified: Secondary | ICD-10-CM | POA: Insufficient documentation

## 2024-05-06 MED ORDER — GADOBUTROL 1 MMOL/ML IV SOLN
7.0000 mL | Freq: Once | INTRAVENOUS | Status: AC | PRN
  Administered 2024-05-06: 7 mL via INTRAVENOUS

## 2024-06-11 ENCOUNTER — Ambulatory Visit: Attending: Family Medicine

## 2024-06-11 DIAGNOSIS — M542 Cervicalgia: Secondary | ICD-10-CM | POA: Insufficient documentation

## 2024-06-11 NOTE — Therapy (Signed)
 OUTPATIENT PHYSICAL THERAPY NECK EVALUATION   Patient Name: Cindy Singh MRN: [id number] DOB:05-04-1941, 83 y.o., female Today's Date: 06/11/2024  END OF SESSION:  PT End of Session - 06/11/24 1642     Visit Number 1    Number of Visits 9    Date for PT Re-Evaluation 08/13/24    Activity Tolerance Patient tolerated treatment well    Behavior During Therapy [facility] for tasks assessed/performed          Past Medical History:  Diagnosis Date   Arthritis    knees   Basal cell carcinoma 02/08/2021   L zygoma BCC Nodular pattern    BCC (basal cell carcinoma) 11/01/2022   right temple, Mohs 11/28/2022   BCC (basal cell carcinoma) 11/01/2022   right upper cutaneous lip, Mohs 11/28/2022   Diabetes mellitus, type 2 (HCC)    Hypertension    Wears dentures    full upper and lower   Past Surgical History:  Procedure Laterality Date   BLADDER SURGERY     CATARACT EXTRACTION W/PHACO Right 08/08/2020   Procedure: CATARACT EXTRACTION PHACO AND INTRAOCULAR LENS PLACEMENT (IOC) RIGHT ISTENT INJ W DIABETIC;  Surgeon: Rosa College, MD;  Location: [facility];  Service: Ophthalmology;  Laterality: Right;  5.08 0:50.2   CATARACT EXTRACTION W/PHACO Left 09/12/2020   Procedure: CATARACT EXTRACTION PHACO AND INTRAOCULAR LENS PLACEMENT (IOC) ISTENT INJ W LEFT DIABETIC 2.69  00:31.0;  Surgeon: Rosa College, MD;  Location: [facility];  Service: Ophthalmology;  Laterality: Left;   Patient Active Problem List   Diagnosis Date Noted   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   Mild cognitive impairment 03/14/2023   Neck pain, chronic 08/06/2022   Other allergic rhinitis 05/23/2022   Strain of other muscles, fascia and tendons at shoulder and upper arm level, right arm, initial encounter 04/16/2022   Epistaxis 10/26/2021   Vitamin B12 deficiency 04/03/2021   Sinus congestion 12/19/2020   Glaucoma 08/13/2019   Numbness of extremity 08/13/2019   Type 2 diabetes mellitus  (HCC) 06/25/2016   TIA (transient ischemic attack) 05/14/2016   Mixed incontinence 06/10/2014   Prolapse of female pelvic organs 07/28/2013   Osteopenia 02/13/2011   Hypertension 12/11/2007   Hyperlipidemia 11/27/2007    PCP[facility]  REFERRING PROVIDER: Alanna Alley, MD  REFERRING DIAG: M54.2 (ICD-10-CM) - Cervicalgia   RATIONALE FOR EVALUATION AND TREATMENT: Rehabilitation  THERAPY DIAG: Neck pain  ONSET DATE: ~3 months ago  FOLLOW-UP APPT SCHEDULED WITH REFERRING PROVIDER: deferred    SUBJECTIVE:  Chief Complaint: neck pain   Pertinent History TIA in April 2025, MRI and neuro exam are clear by referring provider   PMH of cervical surgery 30+ years ago with a plate and screws, memory loss, HTN, HLD, and DMT2  Pain:  Pain Intensity: Present: 5/10, Best: 2/10, Worst: 5/10 Pain location: isolated to central posterior neck, BL mastoid proccesses Pain Quality: intermittent and aching  Radiating: No  Numbness/Tingling: Yes Focal Weakness: No Aggravating factors: looking up, side to side occasionally Relieving factors: soaking it, put cream on it, self-massage  24-hour pain behavior: hard time falling asleep sometimes due to pain but never wakes her up, no time worse than the other  History of prior neck injury, pain, surgery, or therapy: Yes, surgery 30+ yrs ago  Dominant hand: right Imaging: Yes  Red flags (personal history of cancer-skin cancer, h/o spinal tumors, history of compression fracture, chills/fever, night sweats, nausea, vomiting, unrelenting pain): Negative  PRECAUTIONS: None  WEIGHT BEARING RESTRICTIONS: No  FALLS: Has patient fallen in last 6 months? 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  Recent memory is mostly intact.  Remote memory is mostly intact.  Attention span and concentration are intact.  Expressive speech is intact.  Patient's fund of knowledge is within normal limits for educational level.    Gross Musculoskeletal Assessment Tremor: None Bulk: Normal Tone: Normal   Gait Deferred at this time  Posture Rounded shoulders, significant forward head posture, kyphotic sitting and standing posture   AROM AROM (Normal range in degrees) AROM  Cervical  Flexion (50) 82  Extension (80) 30  Right lateral flexion (45) 24  Left lateral flexion (45) 18  Right rotation (85) 45  Left rotation (85) 67  (* = pain; Blank rows = not tested)  MMT MMT (out of 5) Right Left  Cervical (isometric)  Flexion 4  Extension 3  Lateral Flexion 4* 4*  Rotation        Shoulder   Flexion 4+ 4+  Extension    Abduction 5 5  Internal rotation    Horizontal abduction    Horizontal adduction    Lower Trapezius    Rhomboids        Elbow  Flexion 5 5  Extension 5 5  Pronation    Supination        Wrist  Flexion    Extension    Radial deviation    Ulnar deviation        MCP  Flexion    Extension    Abduction    Adduction    (* = pain; Blank rows = not tested)  Sensation Grossly intact to light touch bilateral UE as determined by testing dermatomes C2-T2. Proprioception and hot/cold testing deferred on this date.  Reflexes Not tested  Palpation Location LEFT   RIGHT           Suboccipitals 1 1  Cervical paraspinals 1 1  Upper Trapezius 2 2  Levator Scapulae 1 1  Rhomboid Major/Minor 0 0  SCM 2 2  (Blank rows = not tested) Graded on 0-4 scale (0 = no pain, 1 = pain, 2 = pain with wincing/grimacing/flinching, 3 = pain with withdrawal, 4 = unwilling to allow palpation), (Blank rows = not tested)  TTP on bilat mastoid processes TTP on the origin and insertion points of SCM  Repeated Movements Not tested  Passive Accessory Intervertebral Motion Not tested   SPECIAL TESTS Deferred at this time    TODAY'S TREATMENT: 06/11/24  There-Ex:  Cervical retractions 1 x 10 x 5  Scap squeezes 1 x 10 x 5  Manual Therapy:  STM with effleurage and trigger point mobilization to SCM, suboccipitals, and upper trapezius muscles   PATIENT EDUCATION:  Education details: forward head posture and kyphotic thoracic spine contributing to point tenderness pain in neck, HEP Person educated: Patient Education method: Explanation, Demonstration, Tactile cues, Verbal cues, and Banker  Education comprehension: verbalized understanding, tactile cues required, and needs further education   HOME EXERCISE PROGRAM:  Access Code: [email or web address] URL: [email or web address] Date: 06/11/2024 Prepared by: Crawford Dock  Exercises - Cervical Retraction with Overpressure  - 2 x daily - 7 x weekly - 1 sets - 10 reps - 5s hold - Standing Scapular Retraction  - 2 x daily - 7 x weekly - 1 sets - 10 reps - 5s hold - Correct Standing Posture  - 1 x daily - 7 x weekly   ASSESSMENT:  CLINICAL IMPRESSION: Patient is a 83 y.o. female who was seen today for physical therapy evaluation and treatment for neck pain. Pt has PMH of basal cell carcinoma, HTN, and DM type 2. PSH includes cervical surgery 30+ years ago where a plate and screws were implanted into pt's cervical spine per daughter and pt report. Pt presents with significant forward head posture,  rounded shoulders, and kyphotic thoracic spine in both sitting and standing. Pt has decreased strength in neck extension, neck lateral flexion, and shoulder and neck flexion. Pt has limited and painful ROM in all planes. Pt was very tender to palpation on her bilateral mastoid processes, along with her occipitals, and the origin and insertion of her Sternocleidomastoid. Her limited ROM, weakness, and pain are most likely attributed to the pt's poor posture, specefially her significant forward head posture. Pt was given therex as her HEP to encourage proper upright posture. Pt would benefit from continued skilled physical therapy intervention to increase her strength, improve her posture, decrease her pain, and increase her functional ROM so she can participate in her daily activities pain free.   OBJECTIVE IMPAIRMENTS: decreased mobility, decreased ROM, decreased strength, hypomobility, impaired perceived functional ability, increased muscle spasms, impaired UE functional use, improper body mechanics, postural dysfunction, and pain.   ACTIVITY LIMITATIONS: carrying, sitting, standing, sleeping, reach over head, and caring for others  PARTICIPATION LIMITATIONS: cleaning, laundry, and community activity  PERSONAL FACTORS: Age and 3+ comorbidities: PMH of Basal cell carcinoma, Diabetes Mellitus type 2, HTN are also affecting patient's functional outcome.   REHAB POTENTIAL: Good  CLINICAL DECISION MAKING: Stable/uncomplicated  EVALUATION COMPLEXITY: Low   GOALS: Goals reviewed with patient? Yes  SHORT TERM GOALS: Target date: 06/26/2024  Pt will be independent with HEP to improve strength and decrease neck pain to improve pain-free function at home and work. Baseline:  Goal status: INITIAL   LONG TERM GOALS: Target date: 08/07/2024  1.  Pt will decrease worst neck pain by at least 2 points on  the NPRS in order to demonstrate clinically significant reduction in neck pain. Baseline: 5/10 Goal  status: INITIAL  2.  Pt will decrease NDI score by at least 19% in order demonstrate clinically significant reduction in neck pain/disability.       Baseline: deferred until next session Goal status: INITIAL  3.  Pt will increase her neck ROM in all planes by 7* so she can improve her QOL and be able to perform ADLs around the house.  Baseline: see chart above Goal status: INITIAL   PLAN: PT FREQUENCY: 1-2x/week  PT DURATION: 8 weeks  PLANNED INTERVENTIONS: Therapeutic exercises, Therapeutic activity, Neuromuscular re-education, Balance training, Gait training, Patient/Family education, Self Care, Joint mobilization, Joint manipulation, Vestibular training, Canalith repositioning, Orthotic/Fit training, DME instructions, Dry Needling, Electrical stimulation, Spinal manipulation, Spinal mobilization, Cryotherapy, Moist heat, Taping, Traction, Ultrasound, Ionotophoresis 4mg /ml Dexamethasone, Manual therapy, and Re-evaluation.  PLAN FOR NEXT SESSION: HEP, posture re-ed, periscap strengthening, manual therapy to neck musculature  Sheril Dines, SPT  Candi Chafe PT, DPT, GCS  Sheril Dines, Student-PT 06/11/2024, 4:44 PM

## 2024-06-16 ENCOUNTER — Ambulatory Visit

## 2024-06-16 DIAGNOSIS — M542 Cervicalgia: Secondary | ICD-10-CM

## 2024-06-16 NOTE — Therapy (Unsigned)
 OUTPATIENT PHYSICAL THERAPY NECK TREATMENT   Patient Name: Cindy Singh MRN: [id number] DOB:23-Apr-1941, 83 y.o., female Today's Date: 06/17/2024  END OF SESSION:  PT End of Session - 06/16/24 1446     Visit Number 2    Number of Visits 9    Date for PT Re-Evaluation 08/13/24    Authorization Type eval: 06/11/2024    PT Start Time 1445    PT Stop Time 1530    PT Time Calculation (min) 45 min    Activity Tolerance Patient tolerated treatment well    Behavior During Therapy [facility] for tasks assessed/performed          Past Medical History:  Diagnosis Date   Arthritis    knees   Basal cell carcinoma 02/08/2021   L zygoma BCC Nodular pattern    BCC (basal cell carcinoma) 11/01/2022   right temple, Mohs 11/28/2022   BCC (basal cell carcinoma) 11/01/2022   right upper cutaneous lip, Mohs 11/28/2022   Diabetes mellitus, type 2 (HCC)    Hypertension    Wears dentures    full upper and lower   Past Surgical History:  Procedure Laterality Date   BLADDER SURGERY     CATARACT EXTRACTION W/PHACO Right 08/08/2020   Procedure: CATARACT EXTRACTION PHACO AND INTRAOCULAR LENS PLACEMENT (IOC) RIGHT ISTENT INJ W DIABETIC;  Surgeon: Myrna Adine Anes, MD;  Location: [facility];  Service: Ophthalmology;  Laterality: Right;  5.08 0:50.2   CATARACT EXTRACTION W/PHACO Left 09/12/2020   Procedure: CATARACT EXTRACTION PHACO AND INTRAOCULAR LENS PLACEMENT (IOC) ISTENT INJ W LEFT DIABETIC 2.69  00:31.0;  Surgeon: Myrna Adine Anes, MD;  Location: [facility];  Service: Ophthalmology;  Laterality: Left;   Patient Active Problem List   Diagnosis Date Noted   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   Mild cognitive impairment 03/14/2023   Neck pain, chronic 08/06/2022   Other allergic rhinitis 05/23/2022   Strain of other muscles, fascia and tendons at shoulder and upper arm level, right arm, initial encounter 04/16/2022   Epistaxis 10/26/2021   Vitamin B12 deficiency 04/03/2021    Sinus congestion 12/19/2020   Glaucoma 08/13/2019   Numbness of extremity 08/13/2019   Type 2 diabetes mellitus (HCC) 06/25/2016   TIA (transient ischemic attack) 05/14/2016   Mixed incontinence 06/10/2014   Prolapse of female pelvic organs 07/28/2013   Osteopenia 02/13/2011   Hypertension 12/11/2007   Hyperlipidemia 11/27/2007    [facility]P: [facility]  REFERRING PROVIDER: Renate Cong, MD  REFERRING DIAG: M54.2 (ICD-10-CM) - Cervicalgia   RATIONALE FOR EVALUATION AND TREATMENT: Rehabilitation  THERAPY DIAG: Neck pain  ONSET DATE: ~3 months ago  FOLLOW-UP APPT SCHEDULED WITH REFERRING PROVIDER: deferred   FROM EVAL 06/11/24: SUBJECTIVE:  Chief Complaint: neck pain   Pertinent History TIA in April 2025, MRI and neuro exam are clear by referring provider   PMH of cervical surgery 30+ years ago with a plate and screws, memory loss, HTN, HLD, and DMT2  Pain:  Pain Intensity: Present: 5/10, Best: 2/10, Worst: 5/10 Pain location: isolated to central posterior neck, BL mastoid proccesses Pain Quality: intermittent and aching  Radiating: No  Numbness/Tingling: Yes Focal Weakness: No Aggravating factors: looking up, side to side occasionally Relieving factors: soaking it, put cream on it, self-massage  24-hour pain behavior: hard time falling asleep sometimes due to pain but never wakes her up, no time worse than the other  History of prior neck injury, pain, surgery, or therapy: Yes, surgery 30+ yrs ago  Dominant hand: right Imaging: Yes  Red flags (personal history of cancer-skin cancer, h/o spinal tumors, history of compression fracture, chills/fever, night sweats, nausea, vomiting, unrelenting pain): Negative  PRECAUTIONS: None  WEIGHT BEARING RESTRICTIONS:  No  FALLS: Has patient fallen in last 6 months? 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  Recent memory is mostly intact.  Remote memory is mostly intact.  Attention span and concentration are intact.  Expressive speech is intact.  Patient's fund of knowledge is within normal limits for educational level.    Gross Musculoskeletal Assessment Tremor: None Bulk: Normal Tone: Normal   Gait Deferred at this time  Posture Rounded shoulders, significant forward head posture, kyphotic sitting and standing posture   AROM AROM (Normal range in degrees) AROM  Cervical  Flexion (50) 82  Extension (80) 30  Right lateral flexion (45) 24  Left lateral flexion (45) 18  Right rotation (85) 45  Left rotation (85) 67  (* = pain; Blank rows = not tested)  MMT MMT (out of 5) Right Left  Cervical (isometric)  Flexion 4  Extension 3  Lateral Flexion 4* 4*  Rotation        Shoulder   Flexion 4+ 4+  Extension    Abduction 5 5  Internal rotation    Horizontal abduction    Horizontal adduction    Lower Trapezius    Rhomboids        Elbow  Flexion 5 5  Extension 5 5  Pronation    Supination        Wrist  Flexion    Extension    Radial deviation    Ulnar deviation        MCP  Flexion    Extension    Abduction    Adduction    (* = pain; Blank rows = not tested)  Sensation Grossly intact to light touch bilateral UE as determined by  testing dermatomes C2-T2. Proprioception and hot/cold testing deferred on this date.  Reflexes Not tested  Palpation Location LEFT  RIGHT           Suboccipitals 1 1  Cervical paraspinals 1 1  Upper Trapezius 2 2  Levator Scapulae 1 1  Rhomboid Major/Minor 0 0  SCM 2 2  (Blank rows = not tested) Graded on 0-4 scale (0 = no pain, 1 = pain, 2 = pain with wincing/grimacing/flinching, 3 = pain with withdrawal, 4 = unwilling to allow palpation), (Blank rows = not tested)  TTP on bilat mastoid processes TTP on the origin and insertion points of SCM  Repeated Movements Not tested  Passive Accessory Intervertebral Motion Not tested   SPECIAL TESTS Deferred at this time     TODAY'S TREATMENT: 06/16/24  SUBJECTIVE: Pt reports that she is doing well today. No changes since the initial evaluation. Pt reports minimal neck and shoulder pain upon arrival. No specific questions or concerns.    PAIN: minimal neck pain    OBJECTIVE:  Ther-ex  Arm Bike 3'/3' Supine Cervical retractions 1 x 10 x 5  Supine cervical side-bending x 3 x 20 Supine cervical rotation x 3 x 20 Scap squeezes 1 x 10 x 5 Seated upper trap stretch 2 x 20 ea side  Standing midrow red theraband 2 x 10   Manual Therapy   STM with effleurage and trigger point mobilization to SCM, suboccipitals, and upper trapezius muscles Gentle stretching to UT and SCM    HOME EXERCISE PROGRAM:  Access Code: [email or web address] URL: [email or web address] Date: 06/11/2024 Prepared by: Selinda Eck  Exercises - Cervical Retraction with Overpressure  - 2 x daily - 7 x weekly - 1 sets - 10 reps - 5s hold - Standing Scapular Retraction  - 2 x daily - 7 x weekly - 1 sets - 10 reps - 5s hold - Correct Standing Posture  - 1 x daily - 7 x weekly   ASSESSMENT:  CLINICAL IMPRESSION: Focus of today's session was stretching and strengthening of the neck musculature. Soft tissue mobilization focused on SCM and upper  trap tightness. Pt demonstrated decreased periscapular strength and decreased motor control of neck musculature evidenced by the need for increased manual and tactile cueing needed for neck retractions and scapular squeezes. Pt would benefit from continued skilled physical therapy intervention to increase her strength, improve her posture, decrease her pain, and increase her functional ROM so she can participate in her daily activities pain free.    OBJECTIVE IMPAIRMENTS: decreased mobility, decreased ROM, decreased strength, hypomobility, impaired perceived functional ability, increased muscle spasms, impaired UE functional use, improper body mechanics, postural dysfunction, and pain.   ACTIVITY LIMITATIONS: carrying, sitting, standing, sleeping, reach over head, and caring for others  PARTICIPATION LIMITATIONS: cleaning, laundry, and community activity  PERSONAL FACTORS: Age and 3+ comorbidities: PMH of Basal cell carcinoma, Diabetes Mellitus type 2, HTN are also affecting patient's functional outcome.   REHAB POTENTIAL: Good  CLINICAL DECISION MAKING: Stable/uncomplicated  EVALUATION COMPLEXITY: Low   GOALS: Goals reviewed with patient? Yes  SHORT TERM GOALS: Target date: 07/01/2024  Pt will be independent with HEP to improve strength and decrease neck pain to improve pain-free function at home and work. Baseline:  Goal status: INITIAL   LONG TERM GOALS: Target date: 08/12/2024  1.  Pt will decrease worst neck pain by at least 2 points on the NPRS in order to demonstrate clinically significant reduction in neck pain. Baseline: 5/10 Goal status: INITIAL  2.  Pt will decrease NDI score by at least 19% in order demonstrate clinically significant reduction in neck pain/disability.       Baseline: deferred until next session Goal status: INITIAL  3.  Pt will increase her neck ROM in  all planes by 7* so she can improve her QOL and be able to perform ADLs around the house.  Baseline:  see chart above Goal status: INITIAL   PLAN: PT FREQUENCY: 1-2x/week  PT DURATION: 8 weeks  PLANNED INTERVENTIONS: Therapeutic exercises, Therapeutic activity, Neuromuscular re-education, Balance training, Gait training, Patient/Family education, Self Care, Joint mobilization, Joint manipulation, Vestibular training, Canalith repositioning, Orthotic/Fit training, DME instructions, Dry Needling, Electrical stimulation, Spinal manipulation, Spinal mobilization, Cryotherapy, Moist heat, Taping, Traction, Ultrasound, Ionotophoresis 4mg /ml Dexamethasone, Manual therapy, and Re-evaluation.  PLAN FOR NEXT SESSION:  posture re-ed, periscap strengthening, manual therapy to neck musculature  Vernell Moats, SPT  Selinda BIRCH Huprich PT, DPT, GCS  Huprich,Jason, PT 06/17/2024, 10:57 AM

## 2024-06-18 ENCOUNTER — Ambulatory Visit

## 2024-06-18 DIAGNOSIS — M542 Cervicalgia: Secondary | ICD-10-CM | POA: Diagnosis not present

## 2024-06-18 NOTE — Therapy (Signed)
 OUTPATIENT PHYSICAL THERAPY NECK TREATMENT   Patient Name: Cindy Singh MRN: [id number] DOB:1941-01-29, 83 y.o., female Today's Date: 06/19/2024  END OF SESSION:  PT End of Session - 06/18/24 1140     Visit Number 3    Number of Visits 9    Date for PT Re-Evaluation 08/13/24    Authorization Type eval: 06/11/2024    PT Start Time 1145    PT Stop Time 1232    PT Time Calculation (min) 47 min    Activity Tolerance Patient tolerated treatment well    Behavior During Therapy [facility] for tasks assessed/performed          Past Medical History:  Diagnosis Date   Arthritis    knees   Basal cell carcinoma 02/08/2021   L zygoma BCC Nodular pattern    BCC (basal cell carcinoma) 11/01/2022   right temple, Mohs 11/28/2022   BCC (basal cell carcinoma) 11/01/2022   right upper cutaneous lip, Mohs 11/28/2022   Diabetes mellitus, type 2 (HCC)    Hypertension    Wears dentures    full upper and lower   Past Surgical History:  Procedure Laterality Date   BLADDER SURGERY     CATARACT EXTRACTION W/PHACO Right 08/08/2020   Procedure: CATARACT EXTRACTION PHACO AND INTRAOCULAR LENS PLACEMENT (IOC) RIGHT ISTENT INJ W DIABETIC;  Surgeon: Myrna Adine Anes, MD;  Location: [facility];  Service: Ophthalmology;  Laterality: Right;  5.08 0:50.2   CATARACT EXTRACTION W/PHACO Left 09/12/2020   Procedure: CATARACT EXTRACTION PHACO AND INTRAOCULAR LENS PLACEMENT (IOC) ISTENT INJ W LEFT DIABETIC 2.69  00:31.0;  Surgeon: Myrna Adine Anes, MD;  Location: [facility];  Service: Ophthalmology;  Laterality: Left;   Patient Active Problem List   Diagnosis Date Noted   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   Mild cognitive impairment 03/14/2023   Neck pain, chronic 08/06/2022   Other allergic rhinitis 05/23/2022   Strain of other muscles, fascia and tendons at shoulder and upper arm level, right arm, initial encounter 04/16/2022   Epistaxis 10/26/2021   Vitamin B12 deficiency 04/03/2021    Sinus congestion 12/19/2020   Glaucoma 08/13/2019   Numbness of extremity 08/13/2019   Type 2 diabetes mellitus (HCC) 06/25/2016   TIA (transient ischemic attack) 05/14/2016   Mixed incontinence 06/10/2014   Prolapse of female pelvic organs 07/28/2013   Osteopenia 02/13/2011   Hypertension 12/11/2007   Hyperlipidemia 11/27/2007    PCP: [facility], [facility]  REFERRING PROVIDER: [facility]  REFERRING DIAG: M54.2 (ICD-10-CM) - Cervicalgia   RATIONALE FOR EVALUATION AND TREATMENT: Rehabilitation  THERAPY DIAG: Neck pain  ONSET DATE: ~3 months ago  FOLLOW-UP APPT SCHEDULED WITH REFERRING PROVIDER: deferred   FROM EVAL 06/11/24: SUBJECTIVE:  Chief Complaint: neck pain   Pertinent History TIA in April 2025, MRI and neuro exam are clear by referring provider   PMH of cervical surgery 30+ years ago with a plate and screws, memory loss, HTN, HLD, and DMT2  Pain:  Pain Intensity: Present: 5/10, Best: 2/10, Worst: 5/10 Pain location: isolated to central posterior neck, BL mastoid proccesses Pain Quality: intermittent and aching  Radiating: No  Numbness/Tingling: Yes Focal Weakness: No Aggravating factors: looking up, side to side occasionally Relieving factors: soaking it, put cream on it, self-massage  24-hour pain behavior: hard time falling asleep sometimes due to pain but never wakes her up, no time worse than the other  History of prior neck injury, pain, surgery, or therapy: Yes, surgery 30+ yrs ago  Dominant hand: right Imaging: Yes  Red flags (personal history of cancer-skin cancer, h/o spinal tumors, history of compression fracture, chills/fever, night sweats, nausea, vomiting, unrelenting pain): Negative  PRECAUTIONS: None  WEIGHT BEARING RESTRICTIONS:  No  FALLS: Has patient fallen in last 6 months? 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  Recent memory is mostly intact.  Remote memory is mostly intact.  Attention span and concentration are intact.  Expressive speech is intact.  Patient's fund of knowledge is within normal limits for educational level.    Gross Musculoskeletal Assessment Tremor: None Bulk: Normal Tone: Normal   Gait Deferred at this time  Posture Rounded shoulders, significant forward head posture, kyphotic sitting and standing posture   AROM AROM (Normal range in degrees) AROM  Cervical  Flexion (50) 82  Extension (80) 30  Right lateral flexion (45) 24  Left lateral flexion (45) 18  Right rotation (85) 45  Left rotation (85) 67  (* = pain; Blank rows = not tested)  MMT MMT (out of 5) Right Left  Cervical (isometric)  Flexion 4  Extension 3  Lateral Flexion 4* 4*  Rotation        Shoulder   Flexion 4+ 4+  Extension    Abduction 5 5  Internal rotation    Horizontal abduction    Horizontal adduction    Lower Trapezius    Rhomboids        Elbow  Flexion 5 5  Extension 5 5  Pronation    Supination        Wrist  Flexion    Extension    Radial deviation    Ulnar deviation        MCP  Flexion    Extension    Abduction    Adduction    (* = pain; Blank rows = not tested)  Sensation Grossly intact to light touch bilateral UE as determined by  testing dermatomes C2-T2. Proprioception and hot/cold testing deferred on this date.  Reflexes Not tested  Palpation Location LEFT  RIGHT           Suboccipitals 1 1  Cervical paraspinals 1 1  Upper Trapezius 2 2  Levator Scapulae 1 1  Rhomboid Major/Minor 0 0  SCM 2 2  (Blank rows = not tested) Graded on 0-4 scale (0 = no pain, 1 = pain, 2 = pain with wincing/grimacing/flinching, 3 = pain with withdrawal, 4 = unwilling to allow palpation), (Blank rows = not tested)  TTP on bilat mastoid processes TTP on the origin and insertion points of SCM  Repeated Movements Not tested  Passive Accessory Intervertebral Motion Not tested   SPECIAL TESTS Deferred at this time     TODAY'S TREATMENT: 06/18/24   SUBJECTIVE: Pt reports that she has mild neck pain upon arrival, pt reports increased pain last night. No changes since the initial evaluation. No specific questions or concerns.    PAIN: minimal neck pain    OBJECTIVE:  Ther-ex  Arm Bike 3'/3' Supine Cervical retractions 1 x 10 x 5  Supine cervical side-bending x 3 x 20 Supine cervical rotation x 3 x 20 Scap squeezes 1 x 10 x 5 Seated upper trap stretch 2 x 20 ea side;  Standing midrow red theraband 2 x 10; Standing anchored D2 with red theraband x 10 ea side; Standing shoulder rolls 2 x 10;  Seated W's with red theraband 3 x 10;  Sit to stand with #4 ball chest press x 10; Sit to stand with #4 ball overhead press 2 x 10;   Manual Therapy   STM with effleurage and trigger point mobilization to SCM, suboccipitals, and upper trapezius muscles Gentle stretching to UT and SCM    HOME EXERCISE PROGRAM:  Access Code: [email or web address] URL: [email or web address] Date: 06/11/2024 Prepared by: Selinda Eck  Exercises - Cervical Retraction with Overpressure  - 2 x daily - 7 x weekly - 1 sets - 10 reps - 5s hold - Standing Scapular Retraction  - 2 x daily - 7 x weekly - 1 sets - 10 reps - 5s hold - Correct  Standing Posture  - 1 x daily - 7 x weekly   ASSESSMENT:  CLINICAL IMPRESSION: Focus of today's session was stretching and strengthening of the neck musculature. Soft tissue mobilization focused on SCM and upper trap tightness. Pt demonstrated improved periscapular strength and motor control of neck musculature evidenced by the need for less manual and tactile cueing needed for neck retractions and scapular squeezes. Added the weighted sit to stand this session to encourage upright posture during a functional movement. Pt would benefit from continued skilled physical therapy intervention to increase her strength, improve her posture, decrease her pain, and increase her functional ROM so she can participate in her daily activities pain free.    OBJECTIVE IMPAIRMENTS: decreased mobility, decreased ROM, decreased strength, hypomobility, impaired perceived functional ability, increased muscle spasms, impaired UE functional use, improper body mechanics, postural dysfunction, and pain.   ACTIVITY LIMITATIONS: carrying, sitting, standing, sleeping, reach over head, and caring for others  PARTICIPATION LIMITATIONS: cleaning, laundry, and community activity  PERSONAL FACTORS: Age and 3+ comorbidities: PMH of Basal cell carcinoma, Diabetes Mellitus type 2, HTN are also affecting patient's functional outcome.   REHAB POTENTIAL: Good  CLINICAL DECISION MAKING: Stable/uncomplicated  EVALUATION COMPLEXITY: Low   GOALS: Goals reviewed with patient? Yes  SHORT TERM GOALS: Target date: 07/03/2024  Pt will be independent with HEP to improve strength and decrease neck pain to improve pain-free function at home and work. Baseline:  Goal status: INITIAL   LONG TERM GOALS: Target date: 08/14/2024  1.  Pt will decrease worst neck pain by at least 2 points on  the NPRS in order to demonstrate clinically significant reduction in neck pain. Baseline: 5/10 Goal status: INITIAL  2.  Pt will decrease NDI  score by at least 19% in order demonstrate clinically significant reduction in neck pain/disability.       Baseline: deferred until next session Goal status: INITIAL  3.  Pt will increase her neck ROM in all planes by 7* so she can improve her QOL and be able to perform ADLs around the house.  Baseline: see chart above Goal status: INITIAL   PLAN: PT FREQUENCY: 1-2x/week  PT DURATION: 8 weeks  PLANNED INTERVENTIONS: Therapeutic exercises, Therapeutic activity, Neuromuscular re-education, Balance training, Gait training, Patient/Family education, Self Care, Joint mobilization, Joint manipulation, Vestibular training, Canalith repositioning, Orthotic/Fit training, DME instructions, Dry Needling, Electrical stimulation, Spinal manipulation, Spinal mobilization, Cryotherapy, Moist heat, Taping, Traction, Ultrasound, Ionotophoresis 4mg /ml Dexamethasone, Manual therapy, and Re-evaluation.  PLAN FOR NEXT SESSION:  posture re-ed, periscap strengthening, manual therapy to neck musculature  Vernell Moats, SPT  Selinda BIRCH Huprich PT, DPT, GCS  Huprich,Jason, PT 06/19/2024, 10:37 AM

## 2024-06-23 ENCOUNTER — Ambulatory Visit: Attending: Family Medicine

## 2024-06-23 DIAGNOSIS — M542 Cervicalgia: Secondary | ICD-10-CM | POA: Diagnosis present

## 2024-06-23 NOTE — Therapy (Signed)
 OUTPATIENT PHYSICAL THERAPY NECK TREATMENT   Patient Name: Cindy Singh MRN: [id number] DOB:19-Mar-1941, 83 y.o., female Today's Date: 06/23/2024  END OF SESSION:  PT End of Session - 06/23/24 1512     Visit Number 4    Number of Visits 9    Date for PT Re-Evaluation 08/13/24    Authorization Type eval: 06/11/2024    PT Start Time 1445    PT Stop Time 1530    PT Time Calculation (min) 45 min    Activity Tolerance Patient tolerated treatment well    Behavior During Therapy [facility] for tasks assessed/performed           Past Medical History:  Diagnosis Date   Arthritis    knees   Basal cell carcinoma 02/08/2021   L zygoma BCC Nodular pattern    BCC (basal cell carcinoma) 11/01/2022   right temple, Mohs 11/28/2022   BCC (basal cell carcinoma) 11/01/2022   right upper cutaneous lip, Mohs 11/28/2022   Diabetes mellitus, type 2 (HCC)    Hypertension    Wears dentures    full upper and lower   Past Surgical History:  Procedure Laterality Date   BLADDER SURGERY     CATARACT EXTRACTION W/PHACO Right 08/08/2020   Procedure: CATARACT EXTRACTION PHACO AND INTRAOCULAR LENS PLACEMENT (IOC) RIGHT ISTENT INJ W DIABETIC;  Surgeon: Myrna Adine Anes, MD;  Location: [facility];  Service: Ophthalmology;  Laterality: Right;  5.08 0:50.2   CATARACT EXTRACTION W/PHACO Left 09/12/2020   Procedure: CATARACT EXTRACTION PHACO AND INTRAOCULAR LENS PLACEMENT (IOC) ISTENT INJ W LEFT DIABETIC 2.69  00:31.0;  Surgeon: Myrna Adine Anes, MD;  Location: [facility];  Service: Ophthalmology;  Laterality: Left;   Patient Active Problem List   Diagnosis Date Noted   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   Mild cognitive impairment 03/14/2023   Neck pain, chronic 08/06/2022   Other allergic rhinitis 05/23/2022   Strain of other muscles, fascia and tendons at shoulder and upper arm level, right arm, initial encounter 04/16/2022   Epistaxis 10/26/2021   Vitamin B12 deficiency  04/03/2021   Sinus congestion 12/19/2020   Glaucoma 08/13/2019   Numbness of extremity 08/13/2019   Type 2 diabetes mellitus (HCC) 06/25/2016   TIA (transient ischemic attack) 05/14/2016   Mixed incontinence 06/10/2014   Prolapse of female pelvic organs 07/28/2013   Osteopenia 02/13/2011   Hypertension 12/11/2007   Hyperlipidemia 11/27/2007    PCP: [facility]  REFERRING PROVIDER: Honey Listen, MD  REFERRING DIAG: M54.2 (ICD-10-CM) - Cervicalgia   RATIONALE FOR EVALUATION AND TREATMENT: Rehabilitation  THERAPY DIAG: Neck pain  ONSET DATE: ~3 months ago  FOLLOW-UP APPT SCHEDULED WITH REFERRING PROVIDER: deferred   FROM EVAL 06/11/24: SUBJECTIVE:  Chief Complaint: neck pain   Pertinent History TIA in April 2025, MRI and neuro exam are clear by referring provider   PMH of cervical surgery 30+ years ago with a plate and screws, memory loss, HTN, HLD, and DMT2  Pain:  Pain Intensity: Present: 5/10, Best: 2/10, Worst: 5/10 Pain location: isolated to central posterior neck, BL mastoid proccesses Pain Quality: intermittent and aching  Radiating: No  Numbness/Tingling: Yes Focal Weakness: No Aggravating factors: looking up, side to side occasionally Relieving factors: soaking it, put cream on it, self-massage  24-hour pain behavior: hard time falling asleep sometimes due to pain but never wakes her up, no time worse than the other  History of prior neck injury, pain, surgery, or therapy: Yes, surgery 30+ yrs ago  Dominant hand: right Imaging: Yes  Red flags (personal history of cancer-skin cancer, h/o spinal tumors, history of compression fracture, chills/fever, night sweats, nausea, vomiting, unrelenting pain): Negative  PRECAUTIONS: None  WEIGHT BEARING RESTRICTIONS:  No  FALLS: Has patient fallen in last 6 months? 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  Recent memory is mostly intact.  Remote memory is mostly intact.  Attention span and concentration are intact.  Expressive speech is intact.  Patient's fund of knowledge is within normal limits for educational level.    Gross Musculoskeletal Assessment Tremor: None Bulk: Normal Tone: Normal   Gait Deferred at this time  Posture Rounded shoulders, significant forward head posture, kyphotic sitting and standing posture   AROM AROM (Normal range in degrees) AROM  Cervical  Flexion (50) 82  Extension (80) 30  Right lateral flexion (45) 24  Left lateral flexion (45) 18  Right rotation (85) 45  Left rotation (85) 67  (* = pain; Blank rows = not tested)  MMT MMT (out of 5) Right Left  Cervical (isometric)  Flexion 4  Extension 3  Lateral Flexion 4* 4*  Rotation        Shoulder   Flexion 4+ 4+  Extension    Abduction 5 5  Internal rotation    Horizontal abduction    Horizontal adduction    Lower Trapezius    Rhomboids        Elbow  Flexion 5 5  Extension 5 5  Pronation    Supination        Wrist  Flexion    Extension    Radial deviation    Ulnar deviation        MCP  Flexion    Extension    Abduction    Adduction    (* = pain; Blank rows = not tested)  Sensation Grossly intact to light touch bilateral UE as determined by  testing dermatomes C2-T2. Proprioception and hot/cold testing deferred on this date.  Reflexes Not tested  Palpation Location LEFT  RIGHT           Suboccipitals 1 1  Cervical paraspinals 1 1  Upper Trapezius 2 2  Levator Scapulae 1 1  Rhomboid Major/Minor 0 0  SCM 2 2  (Blank rows = not tested) Graded on 0-4 scale (0 = no pain, 1 = pain, 2 = pain with wincing/grimacing/flinching, 3 = pain with withdrawal, 4 = unwilling to allow palpation), (Blank rows = not tested)  TTP on bilat mastoid processes TTP on the origin and insertion points of SCM  Repeated Movements Not tested  Passive Accessory Intervertebral Motion Not tested   SPECIAL TESTS Deferred at this time     TODAY'S TREATMENT: 06/23/24   SUBJECTIVE: Pt reports that she has mild neck pain upon arrival, pt reports increased pain last night. No changes since the initial evaluation. No specific questions or concerns.    PAIN: minimal neck pain    OBJECTIVE:  Ther-ex  Arm Bike 3'/3' forward/backward Supine Cervical retractions 1 x 10 x 5  Supine cervical side-bending x 3 x 20 Supine cervical rotation x 3 x 20 Scap squeezes 1 x 10 x 5 Seated upper trap stretch 2 x 20 ea side;  Standing midrow red theraband 2 x 10; Standing anchored D2 with red theraband x 10 ea side; Standing shoulder rolls 2 x 10;  Seated W's with red theraband 3 x 10;  Sit to stand with #4 ball chest press x 10; Sit to stand with #4 ball overhead press 2 x 10;   Manual Therapy   STM with effleurage and trigger point mobilization to SCM, suboccipitals, and upper trapezius muscles Gentle stretching to UT and SCM    HOME EXERCISE PROGRAM:  Access Code: [email or web address] URL: [email or web address] Date: 06/11/2024 Prepared by: Selinda Eck  Exercises - Cervical Retraction with Overpressure  - 2 x daily - 7 x weekly - 1 sets - 10 reps - 5s hold - Standing Scapular Retraction  - 2 x daily - 7 x weekly - 1 sets - 10 reps -  5s hold - Correct Standing Posture  - 1 x daily - 7 x weekly   ASSESSMENT:  CLINICAL IMPRESSION: Focus of today's session was stretching and strengthening of the neck musculature. Soft tissue mobilization focused on SCM and upper trap tightness. Pt demonstrated improved periscapular strength and motor control of neck musculature evidenced by the need for less manual and tactile cueing needed for neck retractions and scapular squeezes. Continued the weighted sit to stand this session to encourage upright posture during a functional movement. Pt was able to do shoulder rolls with visual feedback from mirror and demonstration from SPT. Pt would benefit from continued skilled physical therapy intervention to increase her strength, improve her posture, decrease her pain, and increase her functional ROM so she can participate in her daily activities pain free.    OBJECTIVE IMPAIRMENTS: decreased mobility, decreased ROM, decreased strength, hypomobility, impaired perceived functional ability, increased muscle spasms, impaired UE functional use, improper body mechanics, postural dysfunction, and pain.   ACTIVITY LIMITATIONS: carrying, sitting, standing, sleeping, reach over head, and caring for others  PARTICIPATION LIMITATIONS: cleaning, laundry, and community activity  PERSONAL FACTORS: Age and 3+ comorbidities: PMH of Basal cell carcinoma, Diabetes Mellitus type 2, HTN are also affecting patient's functional outcome.   REHAB POTENTIAL: Good  CLINICAL DECISION MAKING: Stable/uncomplicated  EVALUATION COMPLEXITY: Low   GOALS: Goals reviewed with patient? Yes  SHORT TERM GOALS: Target date: 07/07/2024  Pt will be independent with HEP to improve strength and decrease neck pain to improve pain-free function at home and work. Baseline:  Goal status: INITIAL   LONG TERM GOALS: Target  date: 08/18/2024  1.  Pt will decrease worst neck pain by at least 2 points on the NPRS in order to demonstrate  clinically significant reduction in neck pain. Baseline: 5/10 Goal status: INITIAL  2.  Pt will decrease NDI score by at least 19% in order demonstrate clinically significant reduction in neck pain/disability.       Baseline: deferred until next session Goal status: INITIAL  3.  Pt will increase her neck ROM in all planes by 7* so she can improve her QOL and be able to perform ADLs around the house.  Baseline: see chart above Goal status: INITIAL   PLAN: PT FREQUENCY: 1-2x/week  PT DURATION: 8 weeks  PLANNED INTERVENTIONS: Therapeutic exercises, Therapeutic activity, Neuromuscular re-education, Balance training, Gait training, Patient/Family education, Self Care, Joint mobilization, Joint manipulation, Vestibular training, Canalith repositioning, Orthotic/Fit training, DME instructions, Dry Needling, Electrical stimulation, Spinal manipulation, Spinal mobilization, Cryotherapy, Moist heat, Taping, Traction, Ultrasound, Ionotophoresis 4mg /ml Dexamethasone, Manual therapy, and Re-evaluation.  PLAN FOR NEXT SESSION:  posture re-ed, periscap strengthening, manual therapy to neck musculature  Vernell Moats, SPT   Dorina HERO. Fairly IV, PT, DPT Physical Therapist-   [facility] 06/23/2024, 3:47 PM

## 2024-06-25 ENCOUNTER — Ambulatory Visit

## 2024-06-25 DIAGNOSIS — M542 Cervicalgia: Secondary | ICD-10-CM

## 2024-06-25 NOTE — Therapy (Signed)
 OUTPATIENT PHYSICAL THERAPY NECK TREATMENT   Patient Name: Cindy Singh MRN: [id number] DOB:11/24/1941, 83 y.o., female Today's Date: 06/25/2024  END OF SESSION:  PT End of Session - 06/25/24 1137     Visit Number 5    Number of Visits 9    Date for PT Re-Evaluation 08/13/24    Authorization Type eval: 06/11/2024    PT Start Time 1138    PT Stop Time 1220    PT Time Calculation (min) 42 min    Activity Tolerance Patient tolerated treatment well    Behavior During Therapy [facility] for tasks assessed/performed            Past Medical History:  Diagnosis Date   Arthritis    knees   Basal cell carcinoma 02/08/2021   L zygoma BCC Nodular pattern    BCC (basal cell carcinoma) 11/01/2022   right temple, Mohs 11/28/2022   BCC (basal cell carcinoma) 11/01/2022   right upper cutaneous lip, Mohs 11/28/2022   Diabetes mellitus, type 2 (HCC)    Hypertension    Wears dentures    full upper and lower   Past Surgical History:  Procedure Laterality Date   BLADDER SURGERY     CATARACT EXTRACTION W/PHACO Right 08/08/2020   Procedure: CATARACT EXTRACTION PHACO AND INTRAOCULAR LENS PLACEMENT (IOC) RIGHT ISTENT INJ W DIABETIC;  Surgeon: Myrna Adine Anes, MD;  Location: [facility];  Service: Ophthalmology;  Laterality: Right;  5.08 0:50.2   CATARACT EXTRACTION W/PHACO Left 09/12/2020   Procedure: CATARACT EXTRACTION PHACO AND INTRAOCULAR LENS PLACEMENT (IOC) ISTENT INJ W LEFT DIABETIC 2.69  00:31.0;  Surgeon: Myrna Adine Anes, MD;  Location: [facility];  Service: Ophthalmology;  Laterality: Left;   Patient Active Problem List   Diagnosis Date Noted   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   Mild cognitive impairment 03/14/2023   Neck pain, chronic 08/06/2022   Other allergic rhinitis 05/23/2022   Strain of other muscles, fascia and tendons at shoulder and upper arm level, right arm, initial encounter 04/16/2022   Epistaxis 10/26/2021   Vitamin B12 deficiency  04/03/2021   Sinus congestion 12/19/2020   Glaucoma 08/13/2019   Numbness of extremity 08/13/2019   Type 2 diabetes mellitus (HCC) 06/25/2016   TIA (transient ischemic attack) 05/14/2016   Mixed incontinence 06/10/2014   Prolapse of female pelvic organs 07/28/2013   Osteopenia 02/13/2011   Hypertension 12/11/2007   Hyperlipidemia 11/27/2007    PCP: [facility]  REFERRING PROVIDER: Honey Listen, MD  REFERRING DIAG: M54.2 (ICD-10-CM) - Cervicalgia   RATIONALE FOR EVALUATION AND TREATMENT: Rehabilitation  THERAPY DIAG: Neck pain  ONSET DATE: ~3 months ago  FOLLOW-UP APPT SCHEDULED WITH REFERRING PROVIDER: deferred   FROM EVAL 06/11/24: SUBJECTIVE:  Chief Complaint: neck pain   Pertinent History TIA in April 2025, MRI and neuro exam are clear by referring provider   PMH of cervical surgery 30+ years ago with a plate and screws, memory loss, HTN, HLD, and DMT2  Pain:  Pain Intensity: Present: 5/10, Best: 2/10, Worst: 5/10 Pain location: isolated to central posterior neck, BL mastoid proccesses Pain Quality: intermittent and aching  Radiating: No  Numbness/Tingling: Yes Focal Weakness: No Aggravating factors: looking up, side to side occasionally Relieving factors: soaking it, put cream on it, self-massage  24-hour pain behavior: hard time falling asleep sometimes due to pain but never wakes her up, no time worse than the other  History of prior neck injury, pain, surgery, or therapy: Yes, surgery 30+ yrs ago  Dominant hand: right Imaging: Yes  Red flags (personal history of cancer-skin cancer, h/o spinal tumors, history of compression fracture, chills/fever, night sweats, nausea, vomiting, unrelenting pain): Negative  PRECAUTIONS: None  WEIGHT BEARING RESTRICTIONS:  No  FALLS: Has patient fallen in last 6 months? 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  Recent memory is mostly intact.  Remote memory is mostly intact.  Attention span and concentration are intact.  Expressive speech is intact.  Patient's fund of knowledge is within normal limits for educational level.    Gross Musculoskeletal Assessment Tremor: None Bulk: Normal Tone: Normal   Gait Deferred at this time  Posture Rounded shoulders, significant forward head posture, kyphotic sitting and standing posture   AROM AROM (Normal range in degrees) AROM  Cervical  Flexion (50) 82  Extension (80) 30  Right lateral flexion (45) 24  Left lateral flexion (45) 18  Right rotation (85) 45  Left rotation (85) 67  (* = pain; Blank rows = not tested)  MMT MMT (out of 5) Right Left  Cervical (isometric)  Flexion 4  Extension 3  Lateral Flexion 4* 4*  Rotation        Shoulder   Flexion 4+ 4+  Extension    Abduction 5 5  Internal rotation    Horizontal abduction    Horizontal adduction    Lower Trapezius    Rhomboids        Elbow  Flexion 5 5  Extension 5 5  Pronation    Supination        Wrist  Flexion    Extension    Radial deviation    Ulnar deviation        MCP  Flexion    Extension    Abduction    Adduction    (* = pain; Blank rows = not tested)  Sensation Grossly intact to light touch bilateral UE as determined by  testing dermatomes C2-T2. Proprioception and hot/cold testing deferred on this date.  Reflexes Not tested  Palpation Location LEFT  RIGHT           Suboccipitals 1 1  Cervical paraspinals 1 1  Upper Trapezius 2 2  Levator Scapulae 1 1  Rhomboid Major/Minor 0 0  SCM 2 2  (Blank rows = not tested) Graded on 0-4 scale (0 = no pain, 1 = pain, 2 = pain with wincing/grimacing/flinching, 3 = pain with withdrawal, 4 = unwilling to allow palpation), (Blank rows = not tested)  TTP on bilat mastoid processes TTP on the origin and insertion points of SCM  Repeated Movements Not tested  Passive Accessory Intervertebral Motion Not tested   SPECIAL TESTS Deferred at this time     TODAY'S TREATMENT: 06/25/24    SUBJECTIVE: Patient complaining of neck pain on arrival. No new complaints since last session.   PAIN: 5/10 neck pain    OBJECTIVE:  Ther-ex  Arm Bike 3'/3' forward/backward Seated cervical flexion/extension x 10  Seated cervical rotation x 10 Seated cervical sidebending x 10 each Seated upper trap stretch 2 x 30 ea side;  Standing midrow RTB 2 x 12; Standing B shoulder extension with RTB 2 x 10  Sit to stand with #4 ball chest press x 10; Sit to stand with #4 ball overhead press 2 x 10;   Manual Therapy   STM with effleurage and trigger point mobilization to cervical paraspinals, suboccipitals, and upper trapezius muscles Gentle stretching to UT and SCM     HOME EXERCISE PROGRAM:  Access Code: [email or web address] URL: [email or web address] Date: 06/11/2024 Prepared by: Selinda Eck  Exercises - Cervical Retraction with Overpressure  - 2 x daily - 7 x weekly - 1 sets - 10 reps - 5s hold - Standing Scapular Retraction  - 2 x daily - 7 x weekly - 1 sets - 10 reps - 5s hold - Correct Standing Posture  - 1 x daily - 7 x weekly   ASSESSMENT:  CLINICAL IMPRESSION:   Session focused on neck musculature stretching and strengthening. Continues to  demonstrate significant upper trap tightness bilaterally. Tolerated treatment session well with decrease in pain to 4/10 at end of session. Encouraged to follow up as scheduled. Pt would benefit from continued skilled physical therapy intervention to increase her strength, improve her posture, decrease her pain, and increase her functional ROM so she can participate in her daily activities pain free.    OBJECTIVE IMPAIRMENTS: decreased mobility, decreased ROM, decreased strength, hypomobility, impaired perceived functional ability, increased muscle spasms, impaired UE functional use, improper body mechanics, postural dysfunction, and pain.   ACTIVITY LIMITATIONS: carrying, sitting, standing, sleeping, reach over head, and caring for others  PARTICIPATION LIMITATIONS: cleaning, laundry, and community activity  PERSONAL FACTORS: Age and 3+ comorbidities: PMH of Basal cell carcinoma, Diabetes Mellitus type 2, HTN are also affecting patient's functional outcome.   REHAB POTENTIAL: Good  CLINICAL DECISION MAKING: Stable/uncomplicated  EVALUATION COMPLEXITY: Low   GOALS: Goals reviewed with patient? Yes  SHORT TERM GOALS: Target date: 07/09/2024  Pt will be independent with HEP to improve strength and decrease neck pain to improve pain-free function at home and work. Baseline:  Goal status: INITIAL   LONG TERM GOALS: Target date: 08/20/2024  1.  Pt will decrease worst neck pain by at least 2 points on the NPRS in order to demonstrate clinically significant reduction in neck pain. Baseline: 5/10 Goal status: INITIAL  2.  Pt will decrease NDI score by at least 19% in order demonstrate clinically significant reduction in neck pain/disability.       Baseline: deferred until next session Goal status: INITIAL  3.  Pt will increase her neck ROM in all planes by 7* so she can  improve her QOL and be able to perform ADLs around the house.  Baseline: see chart above Goal status:  INITIAL   PLAN: PT FREQUENCY: 1-2x/week  PT DURATION: 8 weeks  PLANNED INTERVENTIONS: Therapeutic exercises, Therapeutic activity, Neuromuscular re-education, Balance training, Gait training, Patient/Family education, Self Care, Joint mobilization, Joint manipulation, Vestibular training, Canalith repositioning, Orthotic/Fit training, DME instructions, Dry Needling, Electrical stimulation, Spinal manipulation, Spinal mobilization, Cryotherapy, Moist heat, Taping, Traction, Ultrasound, Ionotophoresis 4mg /ml Dexamethasone, Manual therapy, and Re-evaluation.  PLAN FOR NEXT SESSION:  posture re-ed, periscap strengthening, manual therapy to neck musculature  Maryanne Finder, PT, DPT  Physical Therapist- [place]  [facility] 06/25/2024, 11:37 AM

## 2024-06-30 ENCOUNTER — Ambulatory Visit

## 2024-06-30 DIAGNOSIS — M542 Cervicalgia: Secondary | ICD-10-CM

## 2024-06-30 NOTE — Therapy (Signed)
 OUTPATIENT PHYSICAL THERAPY NECK TREATMENT   Patient Name: Cindy Singh MRN: [id number] DOB:[birth year], 83 y.o., female Today's Date: 06/30/2024  END OF SESSION:  PT End of Session - 06/30/24 1150     Visit Number 6    Number of Visits 9    Date for PT Re-Evaluation 08/13/24    Authorization Type eval: 06/11/2024    PT Start Time 1148    PT Stop Time 1232    PT Time Calculation (min) 44 min    Activity Tolerance Patient tolerated treatment well    Behavior During Therapy [facility] for tasks assessed/performed         Past Medical History:  Diagnosis Date   Arthritis    knees   Basal cell carcinoma 02/08/2021   L zygoma BCC Nodular pattern    BCC (basal cell carcinoma) 11/01/2022   right temple, Mohs 11/28/2022   BCC (basal cell carcinoma) 11/01/2022   right upper cutaneous lip, Mohs 11/28/2022   Diabetes mellitus, type 2 (HCC)    Hypertension    Wears dentures    full upper and lower   Past Surgical History:  Procedure Laterality Date   BLADDER SURGERY     CATARACT EXTRACTION W/PHACO Right 08/08/2020   Procedure: CATARACT EXTRACTION PHACO AND INTRAOCULAR LENS PLACEMENT (IOC) RIGHT ISTENT INJ W DIABETIC;  Surgeon: Myrna Adine Anes, MD;  Location: [facility];  Service: Ophthalmology;  Laterality: Right;  5.08 0:50.2   CATARACT EXTRACTION W/PHACO Left 09/12/2020   Procedure: CATARACT EXTRACTION PHACO AND INTRAOCULAR LENS PLACEMENT (IOC) ISTENT INJ W LEFT DIABETIC 2.69  00:31.0;  Surgeon: Myrna Adine Anes, MD;  Location: [facility];  Service: Ophthalmology;  Laterality: Left;   Patient Active Problem List   Diagnosis Date Noted   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   Mild cognitive impairment 03/14/2023   Neck pain, chronic 08/06/2022   Other allergic rhinitis 05/23/2022   Strain of other muscles, fascia and tendons at shoulder and upper arm level, right arm, initial encounter 04/16/2022   Epistaxis 10/26/2021   Vitamin B12 deficiency 04/03/2021    Sinus congestion 12/19/2020   Glaucoma 08/13/2019   Numbness of extremity 08/13/2019   Type 2 diabetes mellitus (HCC) 06/25/2016   TIA (transient ischemic attack) 05/14/2016   Mixed incontinence 06/10/2014   Prolapse of female pelvic organs 07/28/2013   Osteopenia 02/13/2011   Hypertension 12/11/2007   Hyperlipidemia 11/27/2007    PCP: [facility]  REFERRING PROVIDER: Honey Listen, MD  REFERRING DIAG: M54.2 (ICD-10-CM) - Cervicalgia   RATIONALE FOR EVALUATION AND TREATMENT: Rehabilitation  THERAPY DIAG: Neck pain  ONSET DATE: ~3 months ago  FOLLOW-UP APPT SCHEDULED WITH REFERRING PROVIDER: deferred   FROM EVAL 06/11/24: SUBJECTIVE:  Chief Complaint: neck pain   Pertinent History TIA in April 2025, MRI and neuro exam are clear by referring provider   PMH of cervical surgery 30+ years ago with a plate and screws, memory loss, HTN, HLD, and DMT2  Pain:  Pain Intensity: Present: 5/10, Best: 2/10, Worst: 5/10 Pain location: isolated to central posterior neck, BL mastoid proccesses Pain Quality: intermittent and aching  Radiating: No  Numbness/Tingling: Yes Focal Weakness: No Aggravating factors: looking up, side to side occasionally Relieving factors: soaking it, put cream on it, self-massage  24-hour pain behavior: hard time falling asleep sometimes due to pain but never wakes her up, no time worse than the other  History of prior neck injury, pain, surgery, or therapy: Yes, surgery 30+ yrs ago  Dominant hand: right Imaging: Yes  Red flags (personal history of cancer-skin cancer, h/o spinal tumors, history of compression fracture, chills/fever, night sweats, nausea, vomiting, unrelenting pain): Negative  PRECAUTIONS: None  WEIGHT BEARING RESTRICTIONS: No  FALLS:  Has patient fallen in last 6 months? 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  Recent memory is mostly intact.  Remote memory is mostly intact.  Attention span and concentration are intact.  Expressive speech is intact.  Patient's fund of knowledge is within normal limits for educational level.    Gross Musculoskeletal Assessment Tremor: None Bulk: Normal Tone: Normal   Gait Deferred at this time  Posture Rounded shoulders, significant forward head posture, kyphotic sitting and standing posture   AROM AROM (Normal range in degrees) AROM  Cervical  Flexion (50) 82  Extension (80) 30  Right lateral flexion (45) 24  Left lateral flexion (45) 18  Right rotation (85) 45  Left rotation (85) 67  (* = pain; Blank rows = not tested)  MMT MMT (out of 5) Right Left  Cervical (isometric)  Flexion 4  Extension 3  Lateral Flexion 4* 4*  Rotation        Shoulder   Flexion 4+ 4+  Extension    Abduction 5 5  Internal rotation    Horizontal abduction    Horizontal adduction    Lower Trapezius    Rhomboids        Elbow  Flexion 5 5  Extension 5 5  Pronation    Supination        Wrist  Flexion    Extension    Radial deviation    Ulnar deviation        MCP  Flexion    Extension    Abduction    Adduction    (* = pain; Blank rows = not tested)  Sensation Grossly intact to light touch bilateral UE as determined by testing  dermatomes C2-T2. Proprioception and hot/cold testing deferred on this date.  Reflexes Not tested  Palpation Location LEFT  RIGHT           Suboccipitals 1 1  Cervical paraspinals 1 1  Upper Trapezius 2 2  Levator Scapulae 1 1  Rhomboid Major/Minor 0 0  SCM 2 2  (Blank rows = not tested) Graded on 0-4 scale (0 = no pain, 1 = pain, 2 = pain with wincing/grimacing/flinching, 3 = pain with withdrawal, 4 = unwilling to allow palpation), (Blank rows = not tested)  TTP on bilat mastoid processes TTP on the origin and insertion points of SCM  Repeated Movements Not tested  Passive Accessory Intervertebral Motion Not tested   SPECIAL TESTS Deferred at this time    TODAY'S TREATMENT: 06/30/24    SUBJECTIVE: Patient reports minimal neck pain on arrival. Pt reports compliance with HEP and has no questions regarding home program. No new complaints since last session.   PAIN: bilat lateral neck pain    OBJECTIVE:   Ther-ex  Arm Bike 3'/3' forward/backward Seated cervical flexion/extension x 10  Seated cervical rotation x 10 Seated cervical sidebending x 10 each Seated upper trap stretch 2 x 30 ea side;  Standing midrow RTB 2 x 12; Standing B shoulder extension with RTB 2 x 12;  Sit to stand with #4 ball chest press x 15; Sit to stand with #4 ball overhead press 2 x 15;   Manual Therapy   STM with effleurage and trigger point mobilization to cervical paraspinals, suboccipitals, and upper trapezius muscles Gentle stretching to UT and SCM     HOME EXERCISE PROGRAM:  Access Code: [email or web address] URL: [email or web address] Date: 06/30/2024 Prepared by: Selinda Eck  Exercises - Cervical Retraction with Overpressure  - 2 x daily - 7 x weekly - 1 sets - 10 reps - 5s hold - Standing Scapular Retraction  - 2 x daily - 7 x weekly - 1 sets - 10 reps - 5s hold - Correct Standing Posture  - 10 x daily - 7 x weekly - Standing Shoulder Row with Anchored Resistance  -  1 x daily - 7 x weekly - 2 sets - 10 reps - 3s hold - Shoulder extension with resistance - Neutral  - 1 x daily - 7 x weekly - 2 sets - 10 reps - 3s hold - Seated Upper Trapezius Stretch  - 1 x daily - 7 x weekly - 5 reps - 30s hold   ASSESSMENT:  CLINICAL IMPRESSION:   Session focused on neck musculature stretching and strengthening. Continues to demonstrate significant upper trap and SCM tightness bilaterally. Pt needed repeat tactile and verbal cues from SPT for proper body mechanics. Updated and reviewed HEP with patient this session. Encouraged to follow up as scheduled. Pt would benefit from continued skilled physical therapy intervention to increase her strength, improve her posture, decrease her pain, and increase her functional ROM so she can participate in her daily activities pain free.    OBJECTIVE IMPAIRMENTS: decreased mobility, decreased ROM, decreased strength, hypomobility, impaired perceived functional ability, increased muscle spasms, impaired UE functional use, improper body mechanics, postural dysfunction, and pain.   ACTIVITY LIMITATIONS: carrying, sitting, standing, sleeping, reach over head, and caring for others  PARTICIPATION LIMITATIONS: cleaning, laundry, and community activity  PERSONAL FACTORS: Age and 3+ comorbidities: PMH of Basal cell carcinoma, Diabetes Mellitus type 2, HTN are also affecting patient's functional outcome.   REHAB POTENTIAL: Good  CLINICAL DECISION MAKING: Stable/uncomplicated  EVALUATION COMPLEXITY: Low   GOALS: Goals reviewed with patient? Yes  SHORT TERM GOALS: Target date: 07/14/2024  Pt will be independent with HEP to improve strength and decrease neck pain to improve pain-free function at home and work. Baseline:  Goal status: INITIAL  LONG TERM GOALS: Target date: 08/25/2024  1.  Pt will decrease worst neck pain by at least 2 points on the NPRS in order to demonstrate clinically significant reduction in neck pain. Baseline:  5/10 Goal status: INITIAL  2.  Pt will decrease NDI score by at least 19% in order demonstrate clinically significant reduction in neck pain/disability.       Baseline: deferred until next session Goal status: INITIAL  3.  Pt will increase her neck ROM in all planes by 7* so she can improve her QOL and be able to perform ADLs around the house.  Baseline: see chart above Goal status: INITIAL   PLAN: PT FREQUENCY: 1-2x/week  PT DURATION: 8 weeks  PLANNED INTERVENTIONS: Therapeutic exercises, Therapeutic activity, Neuromuscular re-education, Balance training, Gait training, Patient/Family education, Self Care, Joint mobilization, Joint manipulation, Vestibular training, Canalith repositioning, Orthotic/Fit training, DME instructions, Dry Needling, Electrical stimulation, Spinal manipulation, Spinal mobilization, Cryotherapy, Moist heat, Taping, Traction, Ultrasound, Ionotophoresis 4mg /ml Dexamethasone, Manual therapy, and Re-evaluation.  PLAN FOR NEXT SESSION:  posture re-ed, periscap strengthening, manual therapy to neck musculature  Vernell Moats, SPT Selinda BIRCH Huprich PT, DPT, [facility]  06/30/2024, 3:00 PM

## 2024-07-02 ENCOUNTER — Ambulatory Visit

## 2024-07-02 DIAGNOSIS — M542 Cervicalgia: Secondary | ICD-10-CM

## 2024-07-02 NOTE — Therapy (Signed)
 OUTPATIENT PHYSICAL THERAPY NECK TREATMENT   Patient Name: Cindy Singh MRN: [id number] DOB:1941-09-28, 83 y.o., female Today's Date: 07/03/2024  END OF SESSION:  PT End of Session - 07/02/24 1154     Visit Number 7    Number of Visits 9    Date for PT Re-Evaluation 08/13/24    Authorization Type eval: 06/11/2024    PT Start Time 1145    PT Stop Time 1230    PT Time Calculation (min) 45 min    Activity Tolerance Patient tolerated treatment well    Behavior During Therapy [facility] for tasks assessed/performed         Past Medical History:  Diagnosis Date   Arthritis    knees   Basal cell carcinoma 02/08/2021   L zygoma BCC Nodular pattern    BCC (basal cell carcinoma) 11/01/2022   right temple, Mohs 11/28/2022   BCC (basal cell carcinoma) 11/01/2022   right upper cutaneous lip, Mohs 11/28/2022   Diabetes mellitus, type 2 (HCC)    Hypertension    Wears dentures    full upper and lower   Past Surgical History:  Procedure Laterality Date   BLADDER SURGERY     CATARACT EXTRACTION W/PHACO Right 08/08/2020   Procedure: CATARACT EXTRACTION PHACO AND INTRAOCULAR LENS PLACEMENT (IOC) RIGHT ISTENT INJ W DIABETIC;  Surgeon: Myrna Adine Anes, MD;  Location: [facility];  Service: Ophthalmology;  Laterality: Right;  5.08 0:50.2   CATARACT EXTRACTION W/PHACO Left 09/12/2020   Procedure: CATARACT EXTRACTION PHACO AND INTRAOCULAR LENS PLACEMENT (IOC) ISTENT INJ W LEFT DIABETIC 2.69  00:31.0;  Surgeon: Myrna Adine Anes, MD;  Location: [facility];  Service: Ophthalmology;  Laterality: Left;   Patient Active Problem List   Diagnosis Date Noted   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   Mild cognitive impairment 03/14/2023   Neck pain, chronic 08/06/2022   Other allergic rhinitis 05/23/2022   Strain of other muscles, fascia and tendons at shoulder and upper arm level, right arm, initial encounter 04/16/2022   Epistaxis 10/26/2021   Vitamin B12 deficiency 04/03/2021    Sinus congestion 12/19/2020   Glaucoma 08/13/2019   Numbness of extremity 08/13/2019   Type 2 diabetes mellitus (HCC) 06/25/2016   TIA (transient ischemic attack) 05/14/2016   Mixed incontinence 06/10/2014   Prolapse of female pelvic organs 07/28/2013   Osteopenia 02/13/2011   Hypertension 12/11/2007   Hyperlipidemia 11/27/2007    PCP: [facility]  REFERRING PROVIDER: Honey Listen, MD  REFERRING DIAG: M54.2 (ICD-10-CM) - Cervicalgia   RATIONALE FOR EVALUATION AND TREATMENT: Rehabilitation  THERAPY DIAG: Neck pain  ONSET DATE: ~3 months ago  FOLLOW-UP APPT SCHEDULED WITH REFERRING PROVIDER: deferred   FROM EVAL 06/11/24: SUBJECTIVE:  Chief Complaint: neck pain   Pertinent History TIA in April 2025, MRI and neuro exam are clear by referring provider   PMH of cervical surgery 30+ years ago with a plate and screws, memory loss, HTN, HLD, and DMT2  Pain:  Pain Intensity: Present: 5/10, Best: 2/10, Worst: 5/10 Pain location: isolated to central posterior neck, BL mastoid proccesses Pain Quality: intermittent and aching  Radiating: No  Numbness/Tingling: Yes Focal Weakness: No Aggravating factors: looking up, side to side occasionally Relieving factors: soaking it, put cream on it, self-massage  24-hour pain behavior: hard time falling asleep sometimes due to pain but never wakes her up, no time worse than the other  History of prior neck injury, pain, surgery, or therapy: Yes, surgery 30+ yrs ago  Dominant hand: right Imaging: Yes  Red flags (personal history of cancer-skin cancer, h/o spinal tumors, history of compression fracture, chills/fever, night sweats, nausea, vomiting, unrelenting pain): Negative  PRECAUTIONS: None  WEIGHT BEARING RESTRICTIONS:  No  FALLS: Has patient fallen in last 6 months? 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  Recent memory is mostly intact.  Remote memory is mostly intact.  Attention span and concentration are intact.  Expressive speech is intact.  Patient's fund of knowledge is within normal limits for educational level.    Gross Musculoskeletal Assessment Tremor: None Bulk: Normal Tone: Normal   Gait Deferred at this time  Posture Rounded shoulders, significant forward head posture, kyphotic sitting and standing posture   AROM AROM (Normal range in degrees) AROM  Cervical  Flexion (50) 82  Extension (80) 30  Right lateral flexion (45) 24  Left lateral flexion (45) 18  Right rotation (85) 45  Left rotation (85) 67  (* = pain; Blank rows = not tested)  MMT MMT (out of 5) Right Left  Cervical (isometric)  Flexion 4  Extension 3  Lateral Flexion 4* 4*  Rotation        Shoulder   Flexion 4+ 4+  Extension    Abduction 5 5  Internal rotation    Horizontal abduction    Horizontal adduction    Lower Trapezius    Rhomboids        Elbow  Flexion 5 5  Extension 5 5  Pronation    Supination        Wrist  Flexion    Extension    Radial deviation    Ulnar deviation        MCP  Flexion    Extension    Abduction    Adduction    (* = pain; Blank rows = not tested)  Sensation Grossly intact to light touch bilateral UE as determined by  testing dermatomes C2-T2. Proprioception and hot/cold testing deferred on this date.  Reflexes Not tested  Palpation Location LEFT  RIGHT           Suboccipitals 1 1  Cervical paraspinals 1 1  Upper Trapezius 2 2  Levator Scapulae 1 1  Rhomboid Major/Minor 0 0  SCM 2 2  (Blank rows = not tested) Graded on 0-4 scale (0 = no pain, 1 = pain, 2 = pain with wincing/grimacing/flinching, 3 = pain with withdrawal, 4 = unwilling to allow palpation), (Blank rows = not tested)  TTP on bilat mastoid processes TTP on the origin and insertion points of SCM  Repeated Movements Not tested  Passive Accessory Intervertebral Motion Not tested   SPECIAL TESTS Deferred at this time    TODAY'S TREATMENT: 07/03/24    SUBJECTIVE: Patient reports minimal neck pain on arrival. Pt reports compliance with HEP and has no questions regarding home program. No new complaints since last session.   PAIN: bilat lateral neck pain    OBJECTIVE:   Ther-ex  Arm Bike 3'/3' forward/backward Seated cervical flexion/extension x 10  Seated cervical rotation x 10 Seated cervical sidebending x 10 each Seated upper trap stretch 2 x 30 ea side;  Seated scap squeezes 10 x 5; Standing midrow GTB 2 x 12; Standing B shoulder extension with GTB 2 x 12;  Standing Palof Press GTB x 10 ea direction; Seated W's with GTB x 10; Sit to stand with #4 ball chest press x 15; Sit to stand with #4 ball overhead press  2 x 15;   Manual Therapy   STM with effleurage and trigger point mobilization to cervical paraspinals, suboccipitals, and upper trapezius muscles Gentle stretching to UT and SCM     HOME EXERCISE PROGRAM:  Access Code: [email or web address] URL: [email or web address] Date: 06/30/2024 Prepared by: Selinda Eck  Exercises - Cervical Retraction with Overpressure  - 2 x daily - 7 x weekly - 1 sets - 10 reps - 5s hold - Standing Scapular Retraction  - 2 x daily - 7 x weekly - 1 sets - 10 reps - 5s  hold - Correct Standing Posture  - 10 x daily - 7 x weekly - Standing Shoulder Row with Anchored Resistance  - 1 x daily - 7 x weekly - 2 sets - 10 reps - 3s hold - Shoulder extension with resistance - Neutral  - 1 x daily - 7 x weekly - 2 sets - 10 reps - 3s hold - Seated Upper Trapezius Stretch  - 1 x daily - 7 x weekly - 5 reps - 30s hold   ASSESSMENT:  CLINICAL IMPRESSION:   Session focused on neck musculature stretching and strengthening. Continues to demonstrate significant upper trap and SCM tightness bilaterally. Pt needed repeat tactile and verbal cues from SPT for proper body mechanics. Updated and reviewed HEP with patient this session. Encouraged to follow up 1x/week secondary to the pt being indpendent with her HEP and due to the chronicity of her sx. Pt would benefit from continued skilled physical therapy intervention to increase her strength, improve her posture, decrease her pain, and increase her functional ROM so she can participate in her daily activities pain free.    OBJECTIVE IMPAIRMENTS: decreased mobility, decreased ROM, decreased strength, hypomobility, impaired perceived functional ability, increased muscle spasms, impaired UE functional use, improper body mechanics, postural dysfunction, and pain.   ACTIVITY LIMITATIONS: carrying, sitting, standing, sleeping, reach over head, and caring for others  PARTICIPATION LIMITATIONS: cleaning, laundry, and community activity  PERSONAL FACTORS: Age and 3+ comorbidities: PMH of Basal cell carcinoma, Diabetes Mellitus type 2, HTN are also affecting patient's functional outcome.   REHAB POTENTIAL: Good  CLINICAL DECISION MAKING: Stable/uncomplicated  EVALUATION COMPLEXITY: Low   GOALS: Goals reviewed  with patient? Yes  SHORT TERM GOALS: Target date: 07/14/2024  Pt will be independent with HEP to improve strength and decrease neck pain to improve pain-free function at home and work. Baseline:  Goal status: MET  07/02/24   LONG TERM GOALS: Target date: 08/25/2024  1.  Pt will decrease worst neck pain by at least 2 points on the NPRS in order to demonstrate clinically significant reduction in neck pain. Baseline: 5/10 Goal status: INITIAL  2.  Pt will decrease NDI score by at least 19% in order demonstrate clinically significant reduction in neck pain/disability.       Baseline: deferred until next session Goal status: INITIAL  3.  Pt will increase her neck ROM in all planes by 7* so she can improve her QOL and be able to perform ADLs around the house.  Baseline: see chart above Goal status: INITIAL   PLAN: PT FREQUENCY: 1-2x/week  PT DURATION: 8 weeks  PLANNED INTERVENTIONS: Therapeutic exercises, Therapeutic activity, Neuromuscular re-education, Balance training, Gait training, Patient/Family education, Self Care, Joint mobilization, Joint manipulation, Vestibular training, Canalith repositioning, Orthotic/Fit training, DME instructions, Dry Needling, Electrical stimulation, Spinal manipulation, Spinal mobilization, Cryotherapy, Moist heat, Taping, Traction, Ultrasound, Ionotophoresis 4mg /ml Dexamethasone, Manual therapy, and Re-evaluation.  PLAN FOR NEXT SESSION:  posture re-ed, periscap strengthening, manual therapy to neck musculature  Vernell Moats, SPT Selinda BIRCH Huprich PT, DPT, GCS  07/03/2024, 11:26 AM

## 2024-07-06 ENCOUNTER — Ambulatory Visit

## 2024-07-09 ENCOUNTER — Ambulatory Visit

## 2024-07-09 DIAGNOSIS — M542 Cervicalgia: Secondary | ICD-10-CM

## 2024-07-09 NOTE — Therapy (Unsigned)
 OUTPATIENT PHYSICAL THERAPY NECK TREATMENT   Patient Name: Cindy Singh MRN: [id number] DOB:[birth year], 83 y.o., female Today's Date: 07/10/2024  END OF SESSION:  PT End of Session - 07/09/24 1148     Visit Number 8    Number of Visits 9    Date for PT Re-Evaluation 08/13/24    Authorization Type eval: 06/11/2024    PT Start Time 1145    PT Stop Time 1230    PT Time Calculation (min) 45 min    Activity Tolerance Patient tolerated treatment well    Behavior During Therapy [facility] for tasks assessed/performed         Past Medical History:  Diagnosis Date   Arthritis    knees   Basal cell carcinoma 02/08/2021   L zygoma BCC Nodular pattern    BCC (basal cell carcinoma) 11/01/2022   right temple, Mohs 11/28/2022   BCC (basal cell carcinoma) 11/01/2022   right upper cutaneous lip, Mohs 11/28/2022   Diabetes mellitus, type 2 (HCC)    Hypertension    Wears dentures    full upper and lower   Past Surgical History:  Procedure Laterality Date   BLADDER SURGERY     CATARACT EXTRACTION W/PHACO Right 08/08/2020   Procedure: CATARACT EXTRACTION PHACO AND INTRAOCULAR LENS PLACEMENT (IOC) RIGHT ISTENT INJ W DIABETIC;  Surgeon: Myrna Adine Anes, MD;  Location: [facility];  Service: Ophthalmology;  Laterality: Right;  5.08 0:50.2   CATARACT EXTRACTION W/PHACO Left 09/12/2020   Procedure: CATARACT EXTRACTION PHACO AND INTRAOCULAR LENS PLACEMENT (IOC) ISTENT INJ W LEFT DIABETIC 2.69  00:31.0;  Surgeon: Myrna Adine Anes, MD;  Location: [facility];  Service: Ophthalmology;  Laterality: Left;   Patient Active Problem List   Diagnosis Date Noted   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   Mild cognitive impairment 03/14/2023   Neck pain, chronic 08/06/2022   Other allergic rhinitis 05/23/2022   Strain of other muscles, fascia and tendons at shoulder and upper arm level, right arm, initial encounter 04/16/2022   Epistaxis 10/26/2021   Vitamin B12 deficiency 04/03/2021    Sinus congestion 12/19/2020   Glaucoma 08/13/2019   Numbness of extremity 08/13/2019   Type 2 diabetes mellitus (HCC) 06/25/2016   TIA (transient ischemic attack) 05/14/2016   Mixed incontinence 06/10/2014   Prolapse of female pelvic organs 07/28/2013   Osteopenia 02/13/2011   Hypertension 12/11/2007   Hyperlipidemia 11/27/2007    PCP: [facility]  REFERRING PROVIDER: Renate Cong, MD  REFERRING DIAG: M54.2 (ICD-10-CM) - Cervicalgia   RATIONALE FOR EVALUATION AND TREATMENT: Rehabilitation  THERAPY DIAG: Neck pain  ONSET DATE: ~3 months ago  FOLLOW-UP APPT SCHEDULED WITH REFERRING PROVIDER: deferred   FROM EVAL 06/11/24: SUBJECTIVE:  Chief Complaint: neck pain   Pertinent History TIA in April 2025, MRI and neuro exam are clear by referring provider   PMH of cervical surgery 30+ years ago with a plate and screws, memory loss, HTN, HLD, and DMT2  Pain:  Pain Intensity: Present: 5/10, Best: 2/10, Worst: 5/10 Pain location: isolated to central posterior neck, BL mastoid proccesses Pain Quality: intermittent and aching  Radiating: No  Numbness/Tingling: Yes Focal Weakness: No Aggravating factors: looking up, side to side occasionally Relieving factors: soaking it, put cream on it, self-massage  24-hour pain behavior: hard time falling asleep sometimes due to pain but never wakes her up, no time worse than the other  History of prior neck injury, pain, surgery, or therapy: Yes, surgery 30+ yrs ago  Dominant hand: right Imaging: Yes  Red flags (personal history of cancer-skin cancer, h/o spinal tumors, history of compression fracture, chills/fever, night sweats, nausea, vomiting, unrelenting pain): Negative  PRECAUTIONS: None  WEIGHT BEARING RESTRICTIONS:  No  FALLS: Has patient fallen in last 6 months? 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  Recent memory is mostly intact.  Remote memory is mostly intact.  Attention span and concentration are intact.  Expressive speech is intact.  Patient's fund of knowledge is within normal limits for educational level.    Gross Musculoskeletal Assessment Tremor: None Bulk: Normal Tone: Normal   Gait Deferred at this time  Posture Rounded shoulders, significant forward head posture, kyphotic sitting and standing posture   AROM AROM (Normal range in degrees) AROM  Cervical  Flexion (50) 82  Extension (80) 30  Right lateral flexion (45) 24  Left lateral flexion (45) 18  Right rotation (85) 45  Left rotation (85) 67  (* = pain; Blank rows = not tested)  MMT MMT (out of 5) Right Left  Cervical (isometric)  Flexion 4  Extension 3  Lateral Flexion 4* 4*  Rotation        Shoulder   Flexion 4+ 4+  Extension    Abduction 5 5  Internal rotation    Horizontal abduction    Horizontal adduction    Lower Trapezius    Rhomboids        Elbow  Flexion 5 5  Extension 5 5  Pronation    Supination        Wrist  Flexion    Extension    Radial deviation    Ulnar deviation        MCP  Flexion    Extension    Abduction    Adduction    (* = pain; Blank rows = not tested)  Sensation Grossly intact to light touch bilateral UE as determined by  testing dermatomes C2-T2. Proprioception and hot/cold testing deferred on this date.  Reflexes Not tested  Palpation Location LEFT  RIGHT           Suboccipitals 1 1  Cervical paraspinals 1 1  Upper Trapezius 2 2  Levator Scapulae 1 1  Rhomboid Major/Minor 0 0  SCM 2 2  (Blank rows = not tested) Graded on 0-4 scale (0 = no pain, 1 = pain, 2 = pain with wincing/grimacing/flinching, 3 = pain with withdrawal, 4 = unwilling to allow palpation), (Blank rows = not tested)  TTP on bilat mastoid processes TTP on the origin and insertion points of SCM  Repeated Movements Not tested  Passive Accessory Intervertebral Motion Not tested   SPECIAL TESTS Deferred at this time    TODAY'S TREATMENT: 07/09/24    SUBJECTIVE: Patient reports minimal neck pain on arrival. Pt reports compliance with HEP and has no questions regarding home program. No new complaints since last session.   PAIN: bilat lateral neck pain    OBJECTIVE:   Ther-ex  Nustep lvl 1-4, 8'; Seated cervical flexion/extension x 10;  Seated cervical rotation x 10; Seated cervical sidebending x 10 each; Seated upper trap stretch 2 x 30 ea side;  Seated scap squeezes 10 x 5; Standing midrow GTB 2 x 12; Standing B shoulder extension with GTB 2 x 12;  Standing Palof Press GTB x 10 ea direction; Seated W's with GTB x 10; Sit to stand with #4 ball chest press x 15; Sit to stand with #4 ball overhead press  x 15; Standing shoulder T's with 3 # DB 2 x 10;  Standing shoulder flexion with 3 # DB 2 x 10;     HOME EXERCISE PROGRAM:  Access Code: [email or web address] URL: [email or web address] Date: 06/30/2024 Prepared by: Selinda Eck  Exercises - Cervical Retraction with Overpressure  - 2 x daily - 7 x weekly - 1 sets - 10 reps - 5s hold - Standing Scapular Retraction  - 2 x daily - 7 x weekly - 1 sets - 10 reps - 5s hold - Correct Standing Posture  - 10 x daily - 7 x weekly - Standing Shoulder Row with  Anchored Resistance  - 1 x daily - 7 x weekly - 2 sets - 10 reps - 3s hold - Shoulder extension with resistance - Neutral  - 1 x daily - 7 x weekly - 2 sets - 10 reps - 3s hold - Seated Upper Trapezius Stretch  - 1 x daily - 7 x weekly - 5 reps - 30s hold   ASSESSMENT:  CLINICAL IMPRESSION:   Session focused on neck musculature stretching and strengthening. Continues to demonstrate significant upper trap and SCM tightness bilaterally. Pt needed repeat tactile and verbal cues from SPT for proper body mechanics. Added standing shoulder ABD and Flexion to promote periscapular strength and stability. Standing shoulder exercises with DB were done with pt's back against the wall for tactile cueing to stand up straight with proper posture.  Reviewed HEP with patient this session. Pt would benefit from continued skilled physical therapy intervention to increase her strength, improve her posture, decrease her pain, and increase her functional ROM so she can participate in her daily activities pain free.    OBJECTIVE IMPAIRMENTS: decreased mobility, decreased ROM, decreased strength, hypomobility, impaired perceived functional ability, increased muscle spasms, impaired UE functional use, improper body mechanics, postural dysfunction, and pain.   ACTIVITY LIMITATIONS: carrying, sitting, standing, sleeping, reach over head, and caring for others  PARTICIPATION LIMITATIONS: cleaning, laundry, and community activity  PERSONAL FACTORS: Age and 3+ comorbidities: PMH of Basal cell carcinoma, Diabetes Mellitus type 2, HTN are also affecting patient's functional outcome.   REHAB POTENTIAL: Good  CLINICAL DECISION MAKING: Stable/uncomplicated  EVALUATION COMPLEXITY: Low  GOALS: Goals reviewed with patient? Yes  SHORT TERM GOALS: Target date: 07/14/2024  Pt will be independent with HEP to improve strength and decrease neck pain to improve pain-free function at home and work. Baseline:  Goal status: MET  07/02/24   LONG TERM GOALS: Target date: 08/25/2024  1.  Pt will decrease worst neck pain by at least 2 points on the NPRS in order to demonstrate clinically significant reduction in neck pain. Baseline: 5/10 Goal status: INITIAL  2.  Pt will decrease NDI score by at least 19% in order demonstrate clinically significant reduction in neck pain/disability.       Baseline: deferred until next session Goal status: INITIAL  3.  Pt will increase her neck ROM in all planes by 7* so she can improve her QOL and be able to perform ADLs around the house.  Baseline: see chart above Goal status: INITIAL   PLAN: PT FREQUENCY: 1-2x/week  PT DURATION: 8 weeks  PLANNED INTERVENTIONS: Therapeutic exercises, Therapeutic activity, Neuromuscular re-education, Balance training, Gait training, Patient/Family education, Self Care, Joint mobilization, Joint manipulation, Vestibular training, Canalith repositioning, Orthotic/Fit training, DME instructions, Dry Needling, Electrical stimulation, Spinal manipulation, Spinal mobilization, Cryotherapy, Moist heat, Taping, Traction, Ultrasound, Ionotophoresis 4mg /ml Dexamethasone, Manual therapy, and Re-evaluation.  PLAN FOR NEXT SESSION:  posture re-ed, periscap strengthening, manual therapy to neck musculature  Vernell Moats, SPT Selinda BIRCH Huprich PT, DPT, GCS  07/10/2024, 10:30 AM

## 2024-07-14 ENCOUNTER — Ambulatory Visit

## 2024-07-14 DIAGNOSIS — M542 Cervicalgia: Secondary | ICD-10-CM

## 2024-07-14 NOTE — Therapy (Unsigned)
 OUTPATIENT PHYSICAL THERAPY NECK TREATMENT   Patient Name: Cindy Singh MRN: [id number] DOB:1941/09/20, 83 y.o., female Today's Date: 07/15/2024  END OF SESSION:  PT End of Session - 07/14/24 1148     Visit Number 9    Number of Visits 17    Date for PT Re-Evaluation 08/13/24    Authorization Type eval: 06/11/2024    PT Start Time 1148    PT Stop Time 1230    PT Time Calculation (min) 42 min    Activity Tolerance Patient tolerated treatment well    Behavior During Therapy [facility] for tasks assessed/performed         Past Medical History:  Diagnosis Date   Arthritis    knees   Basal cell carcinoma 02/08/2021   L zygoma BCC Nodular pattern    BCC (basal cell carcinoma) 11/01/2022   right temple, Mohs 11/28/2022   BCC (basal cell carcinoma) 11/01/2022   right upper cutaneous lip, Mohs 11/28/2022   Diabetes mellitus, type 2 (HCC)    Hypertension    Wears dentures    full upper and lower   Past Surgical History:  Procedure Laterality Date   BLADDER SURGERY     CATARACT EXTRACTION W/PHACO Right 08/08/2020   Procedure: CATARACT EXTRACTION PHACO AND INTRAOCULAR LENS PLACEMENT (IOC) RIGHT ISTENT INJ W DIABETIC;  Surgeon: Myrna Adine Anes, MD;  Location: [facility];  Service: Ophthalmology;  Laterality: Right;  5.08 0:50.2   CATARACT EXTRACTION W/PHACO Left 09/12/2020   Procedure: CATARACT EXTRACTION PHACO AND INTRAOCULAR LENS PLACEMENT (IOC) ISTENT INJ W LEFT DIABETIC 2.69  00:31.0;  Surgeon: Myrna Adine Anes, MD;  Location: [facility];  Service: Ophthalmology;  Laterality: Left;   Patient Active Problem List   Diagnosis Date Noted   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   Mild cognitive impairment 03/14/2023   Neck pain, chronic 08/06/2022   Other allergic rhinitis 05/23/2022   Strain of other muscles, fascia and tendons at shoulder and upper arm level, right arm, initial encounter 04/16/2022   Epistaxis 10/26/2021   Vitamin B12 deficiency 04/03/2021    Sinus congestion 12/19/2020   Glaucoma 08/13/2019   Numbness of extremity 08/13/2019   Type 2 diabetes mellitus (HCC) 06/25/2016   TIA (transient ischemic attack) 05/14/2016   Mixed incontinence 06/10/2014   Prolapse of female pelvic organs 07/28/2013   Osteopenia 02/13/2011   Hypertension 12/11/2007   Hyperlipidemia 11/27/2007    PCP: [facility]  REFERRING PROVIDER: Renate Cong, MD  REFERRING DIAG: M54.2 (ICD-10-CM) - Cervicalgia   RATIONALE FOR EVALUATION AND TREATMENT: Rehabilitation  THERAPY DIAG: Neck pain  ONSET DATE: ~3 months ago  FOLLOW-UP APPT SCHEDULED WITH REFERRING PROVIDER: deferred   FROM EVAL 06/11/24: SUBJECTIVE:  Chief Complaint: neck pain   Pertinent History TIA in April 2025, MRI and neuro exam are clear by referring provider   PMH of cervical surgery 30+ years ago with a plate and screws, memory loss, HTN, HLD, and DMT2  Pain:  Pain Intensity: Present: 5/10, Best: 2/10, Worst: 5/10 Pain location: isolated to central posterior neck, BL mastoid proccesses Pain Quality: intermittent and aching  Radiating: No  Numbness/Tingling: Yes Focal Weakness: No Aggravating factors: looking up, side to side occasionally Relieving factors: soaking it, put cream on it, self-massage  24-hour pain behavior: hard time falling asleep sometimes due to pain but never wakes her up, no time worse than the other  History of prior neck injury, pain, surgery, or therapy: Yes, surgery 30+ yrs ago  Dominant hand: right Imaging: Yes  Red flags (personal history of cancer-skin cancer, h/o spinal tumors, history of compression fracture, chills/fever, night sweats, nausea, vomiting, unrelenting pain): Negative  PRECAUTIONS: None  WEIGHT BEARING RESTRICTIONS:  No  FALLS: Has patient fallen in last 6 months? 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  Recent memory is mostly intact.  Remote memory is mostly intact.  Attention span and concentration are intact.  Expressive speech is intact.  Patient's fund of knowledge is within normal limits for educational level.    Gross Musculoskeletal Assessment Tremor: None Bulk: Normal Tone: Normal   Gait Deferred at this time  Posture Rounded shoulders, significant forward head posture, kyphotic sitting and standing posture   AROM AROM (Normal range in degrees) AROM  Cervical  Flexion (50) 82  Extension (80) 30  Right lateral flexion (45) 24  Left lateral flexion (45) 18  Right rotation (85) 45  Left rotation (85) 67  (* = pain; Blank rows = not tested)  MMT MMT (out of 5) Right Left  Cervical (isometric)  Flexion 4  Extension 3  Lateral Flexion 4* 4*  Rotation        Shoulder   Flexion 4+ 4+  Extension    Abduction 5 5  Internal rotation    Horizontal abduction    Horizontal adduction    Lower Trapezius    Rhomboids        Elbow  Flexion 5 5  Extension 5 5  Pronation    Supination        Wrist  Flexion    Extension    Radial deviation    Ulnar deviation        MCP  Flexion    Extension    Abduction    Adduction    (* = pain; Blank rows = not tested)  Sensation Grossly intact to light touch bilateral UE as determined by  testing dermatomes C2-T2. Proprioception and hot/cold testing deferred on this date.  Reflexes Not tested  Palpation Location LEFT  RIGHT           Suboccipitals 1 1  Cervical paraspinals 1 1  Upper Trapezius 2 2  Levator Scapulae 1 1  Rhomboid Major/Minor 0 0  SCM 2 2  (Blank rows = not tested) Graded on 0-4 scale (0 = no pain, 1 = pain, 2 = pain with wincing/grimacing/flinching, 3 = pain with withdrawal, 4 = unwilling to allow palpation), (Blank rows = not tested)  TTP on bilat mastoid processes TTP on the origin and insertion points of SCM  Repeated Movements Not tested  Passive Accessory Intervertebral Motion Not tested   SPECIAL TESTS Deferred at this time    TODAY'S TREATMENT: 07/14/24    SUBJECTIVE: Patient reports minimal neck pain on arrival. Pt reports compliance with HEP and has no questions regarding home program. No new complaints since last session.   PAIN: bilat lateral neck pain 3/10 NPS   OBJECTIVE:   Ther-ex  UBE 3'/3'; Seated cervical flexion/extension x 10;  Seated cervical rotation x 10; Seated cervical sidebending x 10 each; Seated upper trap stretch 2 x 30 ea side;  Seated scap squeezes 10 x 5; Standing midrow GTB 2 x 12; Standing B shoulder extension with GTB 2 x 12;  Standing Palof Press GTB x 10 ea direction; Standing W's with GTB x 10; Sit to stand with #4 ball chest press x 15; Sit to stand with #4 ball overhead press  x 15; Standing shoulder T's with 3 # DB 2 x 10;  Standing shoulder flexion with 3 # DB 2 x 10;     HOME EXERCISE PROGRAM:  Access Code: [email or web address] URL: [email or web address] Date: 06/30/2024 Prepared by: Selinda Eck  Exercises - Cervical Retraction with Overpressure  - 2 x daily - 7 x weekly - 1 sets - 10 reps - 5s hold - Standing Scapular Retraction  - 2 x daily - 7 x weekly - 1 sets - 10 reps - 5s hold - Correct Standing Posture  - 10 x daily - 7 x weekly - Standing Shoulder Row with  Anchored Resistance  - 1 x daily - 7 x weekly - 2 sets - 10 reps - 3s hold - Shoulder extension with resistance - Neutral  - 1 x daily - 7 x weekly - 2 sets - 10 reps - 3s hold - Seated Upper Trapezius Stretch  - 1 x daily - 7 x weekly - 5 reps - 30s hold   ASSESSMENT:  CLINICAL IMPRESSION:   Session focused on neck musculature stretching and strengthening.  Pt continues to need several repeated tactile and verbal cues from SPT for proper body mechanics. Continued standing shoulder ABD and Flexion this session to promote periscapular strength and stability. Standing shoulder exercises with DB were done with pt's back against the wall for tactile cueing to stand up straight with proper posture.  Reviewed HEP with patient this session. Pt would benefit from continued skilled physical therapy intervention to increase her strength, improve her posture, decrease her pain, and increase her functional ROM so she can participate in her daily activities pain free.    OBJECTIVE IMPAIRMENTS: decreased mobility, decreased ROM, decreased strength, hypomobility, impaired perceived functional ability, increased muscle spasms, impaired UE functional use, improper body mechanics, postural dysfunction, and pain.   ACTIVITY LIMITATIONS: carrying, sitting, standing, sleeping, reach over head, and caring for others  PARTICIPATION LIMITATIONS: cleaning, laundry, and community activity  PERSONAL FACTORS: Age and 3+ comorbidities: PMH of Basal cell carcinoma, Diabetes Mellitus type 2, HTN are also affecting patient's functional outcome.   REHAB POTENTIAL: Good  CLINICAL DECISION MAKING: Stable/uncomplicated  EVALUATION COMPLEXITY: Low   GOALS: Goals reviewed  with patient? Yes  SHORT TERM GOALS: Target date: 07/14/2024  Pt will be independent with HEP to improve strength and decrease neck pain to improve pain-free function at home and work. Baseline:  Goal status: MET 07/02/24   LONG TERM GOALS: Target  date: 08/25/2024  1.  Pt will decrease worst neck pain by at least 2 points on the NPRS in order to demonstrate clinically significant reduction in neck pain. Baseline: 5/10 Goal status: INITIAL  2.  Pt will decrease NDI score by at least 19% in order demonstrate clinically significant reduction in neck pain/disability.       Baseline: deferred until next session Goal status: INITIAL  3.  Pt will increase her neck ROM in all planes by 7* so she can improve her QOL and be able to perform ADLs around the house.  Baseline: see chart above Goal status: INITIAL   PLAN: PT FREQUENCY: 1-2x/week  PT DURATION: 8 weeks  PLANNED INTERVENTIONS: Therapeutic exercises, Therapeutic activity, Neuromuscular re-education, Balance training, Gait training, Patient/Family education, Self Care, Joint mobilization, Joint manipulation, Vestibular training, Canalith repositioning, Orthotic/Fit training, DME instructions, Dry Needling, Electrical stimulation, Spinal manipulation, Spinal mobilization, Cryotherapy, Moist heat, Taping, Traction, Ultrasound, Ionotophoresis 4mg /ml Dexamethasone, Manual therapy, and Re-evaluation.  PLAN FOR NEXT SESSION:  posture re-ed, periscap strengthening, manual therapy to neck musculature  Vernell Moats, SPT Selinda BIRCH Huprich PT, DPT, GCS  07/15/2024, 11:54 AM

## 2024-07-16 ENCOUNTER — Encounter

## 2024-07-20 NOTE — Therapy (Signed)
 OUTPATIENT PHYSICAL THERAPY NECK TREATMENT/PROGRESS NOTE  Dates of reporting period  06/11/24   to   07/21/24    Patient Name: Cindy Singh MRN: [id number] DOB:06-11-1941, 83 y.o., female Today's Date: 07/21/2024  END OF SESSION:  PT End of Session - 07/21/24 1147     Visit Number 10    Number of Visits 17    Date for PT Re-Evaluation 08/13/24    Authorization Type eval: 06/11/2024    PT Start Time 1145    PT Stop Time 1230    PT Time Calculation (min) 45 min    Activity Tolerance Patient tolerated treatment well    Behavior During Therapy [facility] for tasks assessed/performed         Past Medical History:  Diagnosis Date   Arthritis    knees   Basal cell carcinoma 02/08/2021   L zygoma BCC Nodular pattern    BCC (basal cell carcinoma) 11/01/2022   right temple, Mohs 11/28/2022   BCC (basal cell carcinoma) 11/01/2022   right upper cutaneous lip, Mohs 11/28/2022   Diabetes mellitus, type 2 (HCC)    Hypertension    Wears dentures    full upper and lower   Past Surgical History:  Procedure Laterality Date   BLADDER SURGERY     CATARACT EXTRACTION W/PHACO Right 08/08/2020   Procedure: CATARACT EXTRACTION PHACO AND INTRAOCULAR LENS PLACEMENT (IOC) RIGHT ISTENT INJ W DIABETIC;  Surgeon: Myrna Adine Anes, MD;  Location: [facility];  Service: Ophthalmology;  Laterality: Right;  5.08 0:50.2   CATARACT EXTRACTION W/PHACO Left 09/12/2020   Procedure: CATARACT EXTRACTION PHACO AND INTRAOCULAR LENS PLACEMENT (IOC) ISTENT INJ W LEFT DIABETIC 2.69  00:31.0;  Surgeon: Myrna Adine Anes, MD;  Location: [facility];  Service: Ophthalmology;  Laterality: Left;   Patient Active Problem List   Diagnosis Date Noted   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   Mild cognitive impairment 03/14/2023   Neck pain, chronic 08/06/2022   Other allergic rhinitis 05/23/2022   Strain of other muscles, fascia and tendons at shoulder and upper arm level, right arm, initial encounter  04/16/2022   Epistaxis 10/26/2021   Vitamin B12 deficiency 04/03/2021   Sinus congestion 12/19/2020   Glaucoma 08/13/2019   Numbness of extremity 08/13/2019   Type 2 diabetes mellitus (HCC) 06/25/2016   TIA (transient ischemic attack) 05/14/2016   Mixed incontinence 06/10/2014   Prolapse of female pelvic organs 07/28/2013   Osteopenia 02/13/2011   Hypertension 12/11/2007   Hyperlipidemia 11/27/2007    PCP: [facility]  REFERRING PROVIDER: Renate Cong, MD  REFERRING DIAG: M54.2 (ICD-10-CM) - Cervicalgia   RATIONALE FOR EVALUATION AND TREATMENT: Rehabilitation  THERAPY DIAG: Neck pain  ONSET DATE: ~3 months ago  FOLLOW-UP APPT SCHEDULED WITH REFERRING PROVIDER: deferred   FROM EVAL 06/11/24: SUBJECTIVE:  Chief Complaint: neck pain   Pertinent History TIA in April 2025, MRI and neuro exam are clear by referring provider   PMH of cervical surgery 30+ years ago with a plate and screws, memory loss, HTN, HLD, and DMT2  Pain:  Pain Intensity: Present: 5/10, Best: 2/10, Worst: 5/10 Pain location: isolated to central posterior neck, BL mastoid proccesses Pain Quality: intermittent and aching  Radiating: No  Numbness/Tingling: Yes Focal Weakness: No Aggravating factors: looking up, side to side occasionally Relieving factors: soaking it, put cream on it, self-massage  24-hour pain behavior: hard time falling asleep sometimes due to pain but never wakes her up, no time worse than the other  History of prior neck injury, pain, surgery, or therapy: Yes, surgery 30+ yrs ago  Dominant hand: right Imaging: Yes  Red flags (personal history of cancer-skin cancer, h/o spinal tumors, history of compression fracture, chills/fever, night sweats, nausea, vomiting, unrelenting pain):  Negative  PRECAUTIONS: None  WEIGHT BEARING RESTRICTIONS: No  FALLS: Has patient fallen in last 6 months? 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  Recent memory is mostly intact.  Remote memory is mostly intact.  Attention span and concentration are intact.  Expressive speech is intact.  Patient's fund of knowledge is within normal limits for educational level.    Gross Musculoskeletal Assessment Tremor: None Bulk: Normal Tone: Normal   Gait Deferred at this time  Posture Rounded shoulders, significant forward head posture, kyphotic sitting and standing posture   AROM AROM (Normal range in degrees) AROM  Cervical  Flexion (50) 82  Extension (80) 30  Right lateral flexion (45) 24  Left lateral flexion (45) 18  Right rotation (85) 45  Left rotation (85) 67  (* = pain; Blank rows = not tested)  MMT MMT (out of 5) Right Left  Cervical (isometric)  Flexion 4  Extension 3  Lateral Flexion 4* 4*  Rotation        Shoulder   Flexion 4+ 4+  Extension    Abduction 5 5  Internal rotation    Horizontal abduction    Horizontal adduction    Lower Trapezius    Rhomboids        Elbow  Flexion 5 5  Extension 5 5  Pronation    Supination        Wrist  Flexion    Extension    Radial deviation    Ulnar deviation        MCP  Flexion    Extension    Abduction    Adduction    (* = pain; Blank rows = not  tested)  Sensation Grossly intact to light touch bilateral UE as determined by testing dermatomes C2-T2. Proprioception and hot/cold testing deferred on this date.  Reflexes Not tested  Palpation Location LEFT  RIGHT           Suboccipitals 1 1  Cervical paraspinals 1 1  Upper Trapezius 2 2  Levator Scapulae 1 1  Rhomboid Major/Minor 0 0  SCM 2 2  (Blank rows = not tested) Graded on 0-4 scale (0 = no pain, 1 = pain, 2 = pain with wincing/grimacing/flinching, 3 = pain with withdrawal, 4 = unwilling to allow palpation), (Blank rows = not tested)  TTP on bilat mastoid processes TTP on the origin and insertion points of SCM  Repeated Movements Not tested  Passive Accessory Intervertebral Motion Not tested   SPECIAL TESTS Deferred at this time    TODAY'S TREATMENT: 07/21/24    SUBJECTIVE: Patient denies neck pain on arrival. Pt reports compliance with HEP and has no questions regarding home program. No new complaints since last session. Overall she reports approximately 50% improvement in symptoms since starting with therapy.   PAIN: Denies   OBJECTIVE:   Ther-ex  UBE 3'/3' forward/backward for UE strengthening during history (3 minutes unbilled);  Updated outcome measures/goals with patient: NDI: 2% Worst pain: 3/10; Percent improvement: 50%  AROM AROM (Normal range in degrees) 06/11/24 07/21/24  Cervical   Flexion (50) 82 76  Extension (80) 30 42  Right lateral flexion (45) 24 23  Left lateral flexion (45) 18 24  Right rotation (85) 45 52*  Left rotation (85) 67 65  (* = pain; Blank rows = not tested)  Supine cervical retractions with OP by therapist x 10; Supine dowel chest press with manual resistance from therapist 2 x 10; Seated scap squeezes 10 x 5;   Manual Therapy  Supine upper trap and lateral flexion stretches x 45s each bilaterally; Supine MET cervical rotation stretches 5s contract/5s stretch x multiple bouts bilaterally; Supine overhead  pec clavicular attachment stretch with dowel 3 x 30s;   Not performed: Standing Palof Press GTB x 10 ea direction; Standing W's with GTB x 10; Sit to stand with #4 ball chest press x 15; Sit to stand with #4 ball overhead press  x 15; Standing shoulder T's with 3 # DB 2 x 10;  Standing shoulder flexion with 3 # DB 2 x 10;  Standing midrow GTB 2 x 12; Standing B shoulder extension with GTB 2 x 12;     HOME EXERCISE PROGRAM:  Access Code: [email or web address] URL: [email or web address] Date: 06/30/2024 Prepared by: Selinda Eck  Exercises - Cervical Retraction with Overpressure  - 2 x daily - 7 x weekly - 1 sets - 10 reps - 5s hold - Standing Scapular Retraction  - 2 x daily - 7 x weekly - 1 sets - 10 reps - 5s hold - Correct Standing Posture  - 10 x daily - 7 x weekly - Standing Shoulder Row with Anchored Resistance  - 1 x daily - 7 x weekly - 2 sets - 10 reps - 3s hold - Shoulder extension with resistance - Neutral  - 1 x daily - 7 x weekly - 2 sets - 10 reps - 3s hold - Seated Upper Trapezius Stretch  - 1 x daily - 7 x weekly - 5 reps - 30s hold   ASSESSMENT:  CLINICAL IMPRESSION:   Updated outcome measures and goals with patient during session today. Her NDI was only 2% and pt reports at least 50% symptoms reduction since starting therapy. Her worst neck pain over the last week was 3/10 compared to 5/10 at the initial evaluation. Her range of motion has improved in multiple directions but remains most limited in lateral flexion bilaterally, R rotation, and extension. Addition time during session focused on neck musculature stretching and  strengthening. Pt continues to need several repeated tactile and verbal cues from PT for proper body mechanics. Pt would benefit from continued skilled physical therapy intervention to increase her strength, improve her posture, decrease her pain, and increase her functional ROM so she can participate in her daily activities pain free.     OBJECTIVE IMPAIRMENTS: decreased mobility, decreased ROM, decreased strength, hypomobility, impaired perceived functional ability, increased muscle spasms, impaired UE functional use, improper body mechanics, postural dysfunction, and pain.   ACTIVITY LIMITATIONS: carrying, sitting, standing, sleeping, reach over head, and caring for others  PARTICIPATION LIMITATIONS: cleaning, laundry, and community activity  PERSONAL FACTORS: Age and 3+ comorbidities: PMH of Basal cell carcinoma, Diabetes Mellitus type 2, HTN are also affecting patient's functional outcome.   REHAB POTENTIAL: Good  CLINICAL DECISION MAKING: Stable/uncomplicated  EVALUATION COMPLEXITY: Low   GOALS: Goals reviewed with patient? Yes  SHORT TERM GOALS:   Pt will be independent with HEP to improve strength and decrease neck pain to improve pain-free function at home and work. Baseline:  Goal status: MET 07/02/24   LONG TERM GOALS: Target date: 08/13/2024  1.  Pt will decrease worst neck pain by at least 2 points on the NPRS in order to demonstrate clinically significant reduction in neck pain. Baseline: 5/10; 07/21/24: 3/10; Goal status: ACHIEVED  2.  Pt will decrease NDI score by at least 19% in order demonstrate clinically significant reduction in neck pain/disability.    Baseline: deferred until next session; 07/21/24: 2% Goal status: DISCONTINUED  3.  Pt will increase her neck ROM in all planes by 7* so she can improve her QOL and be able to perform ADLs around the house.  Baseline: see chart above; 07/21/24: see chart above; Goal status: PARTIALLY MET   PLAN: PT FREQUENCY: 1-2x/week  PT DURATION: 8 weeks  PLANNED INTERVENTIONS: Therapeutic exercises, Therapeutic activity, Neuromuscular re-education, Balance training, Gait training, Patient/Family education, Self Care, Joint mobilization, Joint manipulation, Vestibular training, Canalith repositioning, Orthotic/Fit training, DME instructions, Dry  Needling, Electrical stimulation, Spinal manipulation, Spinal mobilization, Cryotherapy, Moist heat, Taping, Traction, Ultrasound, Ionotophoresis 4mg /ml Dexamethasone, Manual therapy, and Re-evaluation.  PLAN FOR NEXT SESSION:  posture re-ed, periscap strengthening, manual therapy to neck musculature   Selinda BIRCH Olar Santini PT, DPT, GCS  07/21/2024, 1:10 PM

## 2024-07-21 ENCOUNTER — Ambulatory Visit

## 2024-07-21 DIAGNOSIS — M542 Cervicalgia: Secondary | ICD-10-CM | POA: Diagnosis not present

## 2024-07-23 ENCOUNTER — Encounter

## 2024-07-25 NOTE — Therapy (Signed)
 OUTPATIENT PHYSICAL THERAPY NECK TREATMENT   Patient Name: Cindy Singh MRN: [id number] DOB:1941/08/06, 83 y.o., female Today's Date: 07/28/2024  END OF SESSION:  PT End of Session - 07/28/24 1155     Visit Number 11    Number of Visits 17    Date for PT Re-Evaluation 08/13/24    Authorization Type eval: 06/11/2024    PT Start Time 1145    PT Stop Time 1230    PT Time Calculation (min) 45 min    Activity Tolerance Patient tolerated treatment well    Behavior During Therapy [facility] for tasks assessed/performed          Past Medical History:  Diagnosis Date   Arthritis    knees   Basal cell carcinoma 02/08/2021   L zygoma BCC Nodular pattern    BCC (basal cell carcinoma) 11/01/2022   right temple, Mohs 11/28/2022   BCC (basal cell carcinoma) 11/01/2022   right upper cutaneous lip, Mohs 11/28/2022   Diabetes mellitus, type 2 (HCC)    Hypertension    Wears dentures    full upper and lower   Past Surgical History:  Procedure Laterality Date   BLADDER SURGERY     CATARACT EXTRACTION W/PHACO Right 08/08/2020   Procedure: CATARACT EXTRACTION PHACO AND INTRAOCULAR LENS PLACEMENT (IOC) RIGHT ISTENT INJ W DIABETIC;  Surgeon: Myrna Adine Anes, MD;  Location: [facility];  Service: Ophthalmology;  Laterality: Right;  5.08 0:50.2   CATARACT EXTRACTION W/PHACO Left 09/12/2020   Procedure: CATARACT EXTRACTION PHACO AND INTRAOCULAR LENS PLACEMENT (IOC) ISTENT INJ W LEFT DIABETIC 2.69  00:31.0;  Surgeon: Myrna Adine Anes, MD;  Location: [facility];  Service: Ophthalmology;  Laterality: Left;   Patient Active Problem List   Diagnosis Date Noted   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   Mild cognitive impairment 03/14/2023   Neck pain, chronic 08/06/2022   Other allergic rhinitis 05/23/2022   Strain of other muscles, fascia and tendons at shoulder and upper arm level, right arm, initial encounter 04/16/2022   Epistaxis 10/26/2021   Vitamin B12 deficiency  04/03/2021   Sinus congestion 12/19/2020   Glaucoma 08/13/2019   Numbness of extremity 08/13/2019   Type 2 diabetes mellitus (HCC) 06/25/2016   TIA (transient ischemic attack) 05/14/2016   Mixed incontinence 06/10/2014   Prolapse of female pelvic organs 07/28/2013   Osteopenia 02/13/2011   Hypertension 12/11/2007   Hyperlipidemia 11/27/2007    PCP: [facility]  REFERRING PROVIDER: Honey Listen, MD  REFERRING DIAG: M54.2 (ICD-10-CM) - Cervicalgia   RATIONALE FOR EVALUATION AND TREATMENT: Rehabilitation  THERAPY DIAG: Neck pain  ONSET DATE: ~3 months ago  FOLLOW-UP APPT SCHEDULED WITH REFERRING PROVIDER: deferred   FROM EVAL 06/11/24: SUBJECTIVE:  Chief Complaint: neck pain   Pertinent History TIA in April 2025, MRI and neuro exam are clear by referring provider   PMH of cervical surgery 30+ years ago with a plate and screws, memory loss, HTN, HLD, and DMT2  Pain:  Pain Intensity: Present: 5/10, Best: 2/10, Worst: 5/10 Pain location: isolated to central posterior neck, BL mastoid proccesses Pain Quality: intermittent and aching  Radiating: No  Numbness/Tingling: Yes Focal Weakness: No Aggravating factors: looking up, side to side occasionally Relieving factors: soaking it, put cream on it, self-massage  24-hour pain behavior: hard time falling asleep sometimes due to pain but never wakes her up, no time worse than the other  History of prior neck injury, pain, surgery, or therapy: Yes, surgery 30+ yrs ago  Dominant hand: right Imaging: Yes  Red flags (personal history of cancer-skin cancer, h/o spinal tumors, history of compression fracture, chills/fever, night sweats, nausea, vomiting, unrelenting pain): Negative  PRECAUTIONS: None  WEIGHT BEARING RESTRICTIONS:  No  FALLS: Has patient fallen in last 6 months? 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  Recent memory is mostly intact.  Remote memory is mostly intact.  Attention span and concentration are intact.  Expressive speech is intact.  Patient's fund of knowledge is within normal limits for educational level.    Gross Musculoskeletal Assessment Tremor: None Bulk: Normal Tone: Normal  Posture Rounded shoulders, significant forward head posture, kyphotic sitting and standing posture   AROM AROM (Normal range in degrees) 06/11/24 07/21/24  Cervical   Flexion (50) 82 76  Extension (80) 30 42  Right lateral flexion (45) 24 23  Left lateral flexion (45) 18 24  Right rotation (85) 45 52*  Left rotation (85) 67 65  (* = pain; Blank rows = not tested)  MMT MMT (out of 5) Right Left  Cervical (isometric)  Flexion 4  Extension 3  Lateral Flexion 4* 4*  Rotation        Shoulder   Flexion 4+ 4+  Extension    Abduction 5 5  Internal rotation    Horizontal abduction    Horizontal adduction    Lower Trapezius    Rhomboids        Elbow  Flexion 5 5  Extension 5 5  Pronation    Supination        Wrist  Flexion    Extension    Radial deviation    Ulnar deviation        MCP  Flexion    Extension    Abduction    Adduction    (* = pain; Blank rows = not tested)  Sensation Grossly intact to light touch bilateral UE as determined by  testing dermatomes C2-T2. Proprioception and hot/cold testing deferred on this date.  Reflexes Not tested  Palpation Location LEFT  RIGHT           Suboccipitals 1 1  Cervical paraspinals 1 1  Upper Trapezius 2 2  Levator Scapulae 1 1  Rhomboid Major/Minor 0 0  SCM 2 2  (Blank rows = not tested) Graded on 0-4 scale (0 = no pain, 1 = pain, 2 = pain with wincing/grimacing/flinching, 3 = pain with withdrawal, 4 = unwilling to allow palpation), (Blank rows = not tested)  TTP on bilat mastoid processes TTP on the origin and insertion points of SCM   TODAY'S TREATMENT: 07/28/24    SUBJECTIVE: Patient denies neck pain on arrival. Pt reports compliance with HEP and has no questions regarding home program. No new complaints since last session.    PAIN: Denies   OBJECTIVE:   Ther-ex  UBE 2.5'/2.5' forward/backward for UE strengthening during history (3 minutes unbilled); Seated Nautilus lat pull down 60# x 10, 70# x 10, 80# x 10; Seated Nautilus rows 40# 2 x 10, 50# x 10; Seated scapular retractions with 3 x 10; Seated overhead shoulder press with 3# DB x 10; Seated dowel chest press with manual resistance from therapist 3 x 10; Seated I, Y, and T with 3# DB x 10 each;   Not performed: Standing Palof Press GTB x 10 ea direction; Standing W's with GTB x 10; Sit to stand with #4 ball chest press x 15; Sit to stand with #4 ball overhead press  x 15; Standing midrow GTB 2 x 12; Standing B shoulder extension with GTB 2 x 12;  Supine upper trap and lateral flexion stretches x 45s each bilaterally; Supine MET cervical rotation stretches 5s contract/5s stretch x multiple bouts bilaterally; Supine overhead pec clavicular attachment stretch with dowel 3 x 30s;    HOME EXERCISE PROGRAM:  Access Code: [email or web address] URL: [email or web address] Date: 06/30/2024 Prepared by: Selinda Eck  Exercises - Cervical Retraction with Overpressure  - 2 x daily - 7 x weekly - 1 sets  - 10 reps - 5s hold - Standing Scapular Retraction  - 2 x daily - 7 x weekly - 1 sets - 10 reps - 5s hold - Correct Standing Posture  - 10 x daily - 7 x weekly - Standing Shoulder Row with Anchored Resistance  - 1 x daily - 7 x weekly - 2 sets - 10 reps - 3s hold - Shoulder extension with resistance - Neutral  - 1 x daily - 7 x weekly - 2 sets - 10 reps - 3s hold - Seated Upper Trapezius Stretch  - 1 x daily - 7 x weekly - 5 reps - 30s hold   ASSESSMENT:  CLINICAL IMPRESSION:   Session focused on neck and periscapular strengthening.  Pt continues to need several repeated tactile and verbal cues from PT for proper body mechanics. She denies any increase in pain with exercises today. Pt would benefit from continued skilled physical therapy intervention to increase her strength, improve her posture, decrease her pain, and increase her functional ROM so she can participate in her daily activities pain free.    OBJECTIVE IMPAIRMENTS: decreased mobility, decreased ROM, decreased strength, hypomobility, impaired perceived functional ability, increased muscle spasms, impaired UE functional use, improper body mechanics, postural dysfunction, and pain.   ACTIVITY LIMITATIONS: carrying, sitting, standing, sleeping, reach over head, and caring for others  PARTICIPATION LIMITATIONS: cleaning, laundry, and community activity  PERSONAL FACTORS: Age and 3+ comorbidities: PMH of Basal cell carcinoma, Diabetes Mellitus type 2, HTN are also affecting patient's functional outcome.   REHAB POTENTIAL: Good  CLINICAL DECISION MAKING: Stable/uncomplicated  EVALUATION COMPLEXITY: Low   GOALS: Goals reviewed with patient? Yes  SHORT TERM GOALS:  Pt will be independent with HEP to improve strength and decrease neck pain to improve pain-free function at home and work. Baseline:  Goal status: MET 07/02/24   LONG TERM GOALS: Target date: 08/13/2024  1.  Pt will decrease worst neck pain by at least 2 points  on the NPRS in order to demonstrate clinically significant reduction in neck pain. Baseline: 5/10; 07/21/24: 3/10; Goal status: ACHIEVED  2.  Pt will decrease NDI score by at least 19% in order demonstrate clinically significant reduction in neck pain/disability.    Baseline: deferred until next session; 07/21/24: 2% Goal status: DISCONTINUED  3.  Pt will increase her neck ROM in all planes by 7* so she can improve her QOL and be able to perform ADLs around the house.  Baseline: see chart above; 07/21/24: see chart above; Goal status: PARTIALLY MET   PLAN: PT FREQUENCY: 1-2x/week  PT DURATION: 8 weeks  PLANNED INTERVENTIONS: Therapeutic exercises, Therapeutic activity, Neuromuscular re-education, Balance training, Gait training, Patient/Family education, Self Care, Joint mobilization, Joint manipulation, Vestibular training, Canalith repositioning, Orthotic/Fit training, DME instructions, Dry Needling, Electrical stimulation, Spinal manipulation, Spinal mobilization, Cryotherapy, Moist heat, Taping, Traction, Ultrasound, Ionotophoresis 4mg /ml Dexamethasone, Manual therapy, and Re-evaluation.  PLAN FOR NEXT SESSION:  posture re-ed, periscap strengthening, manual therapy to neck musculature   Selinda BIRCH Fancy Dunkley PT, DPT, GCS  07/28/2024, 4:32 PM

## 2024-07-28 ENCOUNTER — Ambulatory Visit: Attending: Family Medicine

## 2024-07-28 DIAGNOSIS — M542 Cervicalgia: Secondary | ICD-10-CM | POA: Insufficient documentation

## 2024-07-30 ENCOUNTER — Encounter

## 2024-08-03 NOTE — Therapy (Signed)
 OUTPATIENT PHYSICAL THERAPY NECK TREATMENT/DISCHARGE   Patient Name: Cindy Singh MRN: [id number] DOB:[birth year], 83 y.o., female Today's Date: 08/05/2024  END OF SESSION:  PT End of Session - 08/04/24 1152     Visit Number 12    Number of Visits 17    Date for PT Re-Evaluation 08/13/24    Authorization Type eval: 06/11/2024    PT Start Time 1145    PT Stop Time 1230    PT Time Calculation (min) 45 min    Activity Tolerance Patient tolerated treatment well    Behavior During Therapy [facility] for tasks assessed/performed         Past Medical History:  Diagnosis Date   Arthritis    knees   Basal cell carcinoma 02/08/2021   L zygoma BCC Nodular pattern    BCC (basal cell carcinoma) 11/01/2022   right temple, Mohs 11/28/2022   BCC (basal cell carcinoma) 11/01/2022   right upper cutaneous lip, Mohs 11/28/2022   Diabetes mellitus, type 2 (HCC)    Hypertension    Wears dentures    full upper and lower   Past Surgical History:  Procedure Laterality Date   BLADDER SURGERY     CATARACT EXTRACTION W/PHACO Right 08/08/2020   Procedure: CATARACT EXTRACTION PHACO AND INTRAOCULAR LENS PLACEMENT (IOC) RIGHT ISTENT INJ W DIABETIC;  Surgeon: Myrna Adine Anes, MD;  Location: [facility];  Service: Ophthalmology;  Laterality: Right;  5.08 0:50.2   CATARACT EXTRACTION W/PHACO Left 09/12/2020   Procedure: CATARACT EXTRACTION PHACO AND INTRAOCULAR LENS PLACEMENT (IOC) ISTENT INJ W LEFT DIABETIC 2.69  00:31.0;  Surgeon: Myrna Adine Anes, MD;  Location: [facility];  Service: Ophthalmology;  Laterality: Left;   Patient Active Problem List   Diagnosis Date Noted   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   Mild cognitive impairment 03/14/2023   Neck pain, chronic 08/06/2022   Other allergic rhinitis 05/23/2022   Strain of other muscles, fascia and tendons at shoulder and upper arm level, right arm, initial encounter 04/16/2022   Epistaxis 10/26/2021   Vitamin B12 deficiency  04/03/2021   Sinus congestion 12/19/2020   Glaucoma 08/13/2019   Numbness of extremity 08/13/2019   Type 2 diabetes mellitus (HCC) 06/25/2016   TIA (transient ischemic attack) 05/14/2016   Mixed incontinence 06/10/2014   Prolapse of female pelvic organs 07/28/2013   Osteopenia 02/13/2011   Hypertension 12/11/2007   Hyperlipidemia 11/27/2007    PCP: [facility]  REFERRING PROVIDER: Renate Cong, MD  REFERRING DIAG: M54.2 (ICD-10-CM) - Cervicalgia   RATIONALE FOR EVALUATION AND TREATMENT: Rehabilitation  THERAPY DIAG: Neck pain  ONSET DATE: ~3 months ago  FOLLOW-UP APPT SCHEDULED WITH REFERRING PROVIDER: deferred   FROM EVAL 06/11/24: SUBJECTIVE:  Chief Complaint: neck pain   Pertinent History TIA in April 2025, MRI and neuro exam are clear by referring provider   PMH of cervical surgery 30+ years ago with a plate and screws, memory loss, HTN, HLD, and DMT2  Pain:  Pain Intensity: Present: 5/10, Best: 2/10, Worst: 5/10 Pain location: isolated to central posterior neck, BL mastoid proccesses Pain Quality: intermittent and aching  Radiating: No  Numbness/Tingling: Yes Focal Weakness: No Aggravating factors: looking up, side to side occasionally Relieving factors: soaking it, put cream on it, self-massage  24-hour pain behavior: hard time falling asleep sometimes due to pain but never wakes her up, no time worse than the other  History of prior neck injury, pain, surgery, or therapy: Yes, surgery 30+ yrs ago  Dominant hand: right Imaging: Yes  Red flags (personal history of cancer-skin cancer, h/o spinal tumors, history of compression fracture, chills/fever, night sweats, nausea, vomiting, unrelenting pain): Negative  PRECAUTIONS: None  WEIGHT BEARING  RESTRICTIONS: No  FALLS: Has patient fallen in last 6 months? 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  Recent memory is mostly intact.  Remote memory is mostly intact.  Attention span and concentration are intact.  Expressive speech is intact.  Patient's fund of knowledge is within normal limits for educational level.    Gross Musculoskeletal Assessment Tremor: None Bulk: Normal Tone: Normal  Posture Rounded shoulders, significant forward head posture, kyphotic sitting and standing posture   AROM AROM (Normal range in degrees) 06/11/24 07/21/24  Cervical   Flexion (50) 82 76  Extension (80) 30 42  Right lateral flexion (45) 24 23  Left lateral flexion (45) 18 24  Right rotation (85) 45 52*  Left rotation (85) 67 65  (* = pain; Blank rows = not tested)  MMT MMT (out of 5) Right Left  Cervical (isometric)  Flexion 4  Extension 3  Lateral Flexion 4* 4*  Rotation        Shoulder   Flexion 4+ 4+  Extension    Abduction 5 5  Internal rotation    Horizontal abduction    Horizontal adduction    Lower Trapezius    Rhomboids        Elbow  Flexion 5 5  Extension 5 5  Pronation    Supination        Wrist  Flexion    Extension    Radial deviation    Ulnar deviation        MCP  Flexion    Extension    Abduction    Adduction    (* = pain; Blank rows = not tested)  Sensation Grossly intact to light touch bilateral UE as  determined by testing dermatomes C2-T2. Proprioception and hot/cold testing deferred on this date.  Reflexes Not tested  Palpation Location LEFT  RIGHT           Suboccipitals 1 1  Cervical paraspinals 1 1  Upper Trapezius 2 2  Levator Scapulae 1 1  Rhomboid Major/Minor 0 0  SCM 2 2  (Blank rows = not tested) Graded on 0-4 scale (0 = no pain, 1 = pain, 2 = pain with wincing/grimacing/flinching, 3 = pain with withdrawal, 4 = unwilling to allow palpation), (Blank rows = not tested)  TTP on bilat mastoid processes TTP on the origin and insertion points of SCM   TODAY'S TREATMENT: 08/04/24    SUBJECTIVE: Patient denies neck pain on arrival. Pt reports compliance with HEP and has no questions regarding home program. No new complaints since last session.    PAIN: Denies   OBJECTIVE:   Ther-ex  UBE 2.5'/2.5' forward/backward for UE strengthening during history (3 minutes unbilled); Seated Nautilus lat pull down 80# 3 x 10; Seated Nautilus rows 50# 3 x 10; Nautilus shoulder extension 30# 3 x 10; Seated overhead shoulder press with 3# DB x 10; Seated I, Y, and T with 3# DB 2 x 10 each; Discharge education provided;    HOME EXERCISE PROGRAM:  Access Code: [email or web address] URL: [email or web address] Date: 06/30/2024 Prepared by: Selinda Eck  Exercises - Cervical Retraction with Overpressure  - 2 x daily - 7 x weekly - 1 sets - 10 reps - 5s hold - Standing Scapular Retraction  - 2 x daily - 7 x weekly - 1 sets - 10 reps - 5s hold - Correct Standing Posture  - 10 x daily - 7 x weekly - Standing Shoulder Row with Anchored Resistance  - 1 x daily - 7 x weekly - 2 sets - 10 reps - 3s hold - Shoulder extension with resistance - Neutral  - 1 x daily - 7 x weekly - 2 sets - 10 reps - 3s hold - Seated Upper Trapezius Stretch  - 1 x daily - 7 x weekly - 5 reps - 30s hold   ASSESSMENT:  CLINICAL IMPRESSION:   Updated outcome measures and goals with patient during 07/21/24  visit. Her NDI was only 2% and pt reports at least 50% symptoms reduction since starting therapy. Her worst neck pain decreased to 3/10 compared to 5/10 at the initial evaluation. Her range of motion has improved in multiple directions but remains most limited in lateral flexion bilaterally, R rotation, and extension. Session focused on neck and periscapular strengthening.  Pt continues to need several repeated tactile and verbal cues from PT for proper body mechanics. She denies any increase in pain with exercises today. Reviewed HEP and pt encouraged to continue independently at home. She is ready for discharge on this date.    OBJECTIVE IMPAIRMENTS: decreased mobility, decreased ROM, decreased strength, hypomobility, impaired perceived functional ability, increased muscle spasms, impaired UE functional use, improper body mechanics, postural dysfunction, and pain.   ACTIVITY LIMITATIONS: carrying, sitting, standing, sleeping, reach over head, and caring for others  PARTICIPATION LIMITATIONS: cleaning, laundry, and community activity  PERSONAL FACTORS: Age and 3+ comorbidities: PMH of Basal cell carcinoma, Diabetes Mellitus type 2, HTN are also affecting patient's functional outcome.   REHAB POTENTIAL: Good  CLINICAL DECISION MAKING: Stable/uncomplicated  EVALUATION COMPLEXITY: Low   GOALS: Goals reviewed with patient? Yes  SHORT TERM GOALS:   Pt will be independent with HEP to improve strength and decrease neck pain to improve pain-free function at home and work. Baseline:  Goal status: MET 07/02/24   LONG TERM GOALS: Target date: 08/13/2024  1.  Pt will decrease worst neck pain by at least 2 points on the NPRS in order to demonstrate clinically significant  reduction in neck pain. Baseline: 5/10; 07/21/24: 3/10; Goal status: ACHIEVED  2.  Pt will decrease NDI score by at least 19% in order demonstrate clinically significant reduction in neck pain/disability.    Baseline: deferred  until next session; 07/21/24: 2% Goal status: DISCONTINUED  3.  Pt will increase her neck ROM in all planes by 7* so she can improve her QOL and be able to perform ADLs around the house.  Baseline: see chart above; 07/21/24: see chart above; Goal status: PARTIALLY MET   PLAN: PT FREQUENCY: 1-2x/week  PT DURATION: 8 weeks  PLANNED INTERVENTIONS: Therapeutic exercises, Therapeutic activity, Neuromuscular re-education, Balance training, Gait training, Patient/Family education, Self Care, Joint mobilization, Joint manipulation, Vestibular training, Canalith repositioning, Orthotic/Fit training, DME instructions, Dry Needling, Electrical stimulation, Spinal manipulation, Spinal mobilization, Cryotherapy, Moist heat, Taping, Traction, Ultrasound, Ionotophoresis 4mg /ml Dexamethasone, Manual therapy, and Re-evaluation.  PLAN FOR NEXT SESSION:  Discharge   Selinda JONETTA Eck PT, DPT, GCS  08/05/2024, 9:10 AM

## 2024-08-04 ENCOUNTER — Ambulatory Visit

## 2024-08-04 DIAGNOSIS — M542 Cervicalgia: Secondary | ICD-10-CM

## 2024-08-06 ENCOUNTER — Encounter

## 2024-08-17 ENCOUNTER — Other Ambulatory Visit: Payer: Self-pay | Admitting: Family Medicine

## 2024-08-17 DIAGNOSIS — Z780 Asymptomatic menopausal state: Secondary | ICD-10-CM

## 2024-09-02 ENCOUNTER — Other Ambulatory Visit: Payer: Self-pay | Admitting: Internal Medicine

## 2024-09-02 DIAGNOSIS — Z1231 Encounter for screening mammogram for malignant neoplasm of breast: Secondary | ICD-10-CM

## 2024-11-03 ENCOUNTER — Ambulatory Visit: Admitting: Dermatology

## 2024-11-16 ENCOUNTER — Encounter: Payer: Self-pay | Admitting: Dermatology

## 2024-11-16 ENCOUNTER — Ambulatory Visit (INDEPENDENT_AMBULATORY_CARE_PROVIDER_SITE_OTHER): Admitting: Dermatology

## 2024-11-16 DIAGNOSIS — Z85828 Personal history of other malignant neoplasm of skin: Secondary | ICD-10-CM

## 2024-11-16 DIAGNOSIS — D225 Melanocytic nevi of trunk: Secondary | ICD-10-CM

## 2024-11-16 DIAGNOSIS — D1801 Hemangioma of skin and subcutaneous tissue: Secondary | ICD-10-CM

## 2024-11-16 DIAGNOSIS — Z1283 Encounter for screening for malignant neoplasm of skin: Secondary | ICD-10-CM | POA: Diagnosis not present

## 2024-11-16 DIAGNOSIS — L814 Other melanin hyperpigmentation: Secondary | ICD-10-CM

## 2024-11-16 DIAGNOSIS — L821 Other seborrheic keratosis: Secondary | ICD-10-CM

## 2024-11-16 DIAGNOSIS — W908XXA Exposure to other nonionizing radiation, initial encounter: Secondary | ICD-10-CM | POA: Diagnosis not present

## 2024-11-16 DIAGNOSIS — L578 Other skin changes due to chronic exposure to nonionizing radiation: Secondary | ICD-10-CM | POA: Diagnosis not present

## 2024-11-16 DIAGNOSIS — D229 Melanocytic nevi, unspecified: Secondary | ICD-10-CM

## 2024-11-16 NOTE — Progress Notes (Signed)
   Follow-Up Visit   Subjective  Cindy Singh is a 83 y.o. female who presents for the following: Skin Cancer Screening and Full Body Skin Exam. HxBCCs x3.  The patient presents for Total-Body Skin Exam (TBSE) for skin cancer screening and mole check. The patient has spots, moles and lesions to be evaluated, some may be new or changing and the patient may have concern these could be cancer.    The following portions of the chart were reviewed this encounter and updated as appropriate: medications, allergies, medical history  Review of Systems:  No other skin or systemic complaints except as noted in HPI or Assessment and Plan.  Objective  Well appearing patient in no apparent distress; mood and affect are within normal limits.  A full examination was performed including scalp, head, eyes, ears, nose, lips, neck, chest, axillae, abdomen, back, buttocks, bilateral upper extremities, bilateral lower extremities, hands, feet, fingers, toes, fingernails, and toenails. All findings within normal limits unless otherwise noted below.   Relevant physical exam findings are noted in the Assessment and Plan.     Assessment & Plan   SKIN CANCER SCREENING PERFORMED TODAY.  ACTINIC DAMAGE - Chronic condition, secondary to cumulative UV/sun exposure - diffuse scaly erythematous macules with underlying dyspigmentation - Recommend daily broad spectrum sunscreen SPF 30+ to sun-exposed areas, reapply every 2 hours as needed.  - Staying in the shade or wearing long sleeves, sun glasses (UVA+UVB protection) and wide brim hats (4-inch brim around the entire circumference of the hat) are also recommended for sun protection.  - Call for new or changing lesions.  LENTIGINES, SEBORRHEIC KERATOSES, HEMANGIOMAS - Benign normal skin lesions - Benign-appearing - Call for any changes  MELANOCYTIC NEVI - Tan-brown and/or pink-flesh-colored symmetric macules and papules - Benign appearing on exam  today - Observation - Call clinic for new or changing moles - Recommend daily use of broad spectrum spf 30+ sunscreen to sun-exposed areas.   HISTORY OF BASAL CELL CARCINOMA OF THE SKIN - L zygoma 02/08/2021. Left temple Mohs 11/28/2022, right upper cutaneous lip Mohs 11/28/2022  - No evidence of recurrence today - Recommend regular full body skin exams - Recommend daily broad spectrum sunscreen SPF 30+ to sun-exposed areas, reapply every 2 hours as needed.  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 [place] Daring macules or papules within lighter tan patch - Genetic - Benign, observe - Call for any changes    MULTIPLE BENIGN NEVI   LENTIGINES   ACTINIC ELASTOSIS   SEBORRHEIC KERATOSES   CHERRY ANGIOMA   NEVUS SPILUS OF BACK    Return in about 1 year (around 11/16/2025) for TBSE, HxBCCs.  I, Jill Parcell, CMA, am acting as scribe for Boneta Sharps, MD.    Documentation: I have reviewed the above documentation for accuracy and completeness, and I agree with the above.  Boneta Sharps, MD

## 2024-11-16 NOTE — Patient Instructions (Addendum)

## 2025-11-16 ENCOUNTER — Ambulatory Visit: Admitting: Dermatology
